# Patient Record
Sex: Female | Born: 1985 | Race: Black or African American | Hispanic: No | Marital: Single | Smoking: Never smoker
Health system: Southern US, Community
[De-identification: ages and names within clinical notes are randomized; demographics above are authoritative.]

## PROBLEM LIST (undated history)

## (undated) ENCOUNTER — Inpatient Hospital Stay (HOSPITAL_COMMUNITY): Payer: Self-pay

## (undated) DIAGNOSIS — R011 Cardiac murmur, unspecified: Secondary | ICD-10-CM

## (undated) DIAGNOSIS — R519 Headache, unspecified: Secondary | ICD-10-CM

## (undated) DIAGNOSIS — R51 Headache: Secondary | ICD-10-CM

## (undated) DIAGNOSIS — F32A Depression, unspecified: Secondary | ICD-10-CM

## (undated) DIAGNOSIS — F419 Anxiety disorder, unspecified: Secondary | ICD-10-CM

## (undated) DIAGNOSIS — B999 Unspecified infectious disease: Secondary | ICD-10-CM

## (undated) DIAGNOSIS — F329 Major depressive disorder, single episode, unspecified: Secondary | ICD-10-CM

## (undated) DIAGNOSIS — R87629 Unspecified abnormal cytological findings in specimens from vagina: Secondary | ICD-10-CM

## (undated) DIAGNOSIS — O365990 Maternal care for other known or suspected poor fetal growth, unspecified trimester, not applicable or unspecified: Secondary | ICD-10-CM

## (undated) DIAGNOSIS — E669 Obesity, unspecified: Secondary | ICD-10-CM

## (undated) HISTORY — PX: CRYOTHERAPY: SHX1416

## (undated) HISTORY — PX: WISDOM TOOTH EXTRACTION: SHX21

---
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
## 2007-02-25 ENCOUNTER — Inpatient Hospital Stay (HOSPITAL_COMMUNITY): Admission: AD | Admit: 2007-02-25 | Discharge: 2007-02-25 | Payer: Self-pay | Admitting: Obstetrics & Gynecology

## 2007-08-04 ENCOUNTER — Inpatient Hospital Stay (HOSPITAL_COMMUNITY): Admission: AD | Admit: 2007-08-04 | Discharge: 2007-08-04 | Payer: Self-pay | Admitting: Pediatrics

## 2007-09-25 ENCOUNTER — Inpatient Hospital Stay (HOSPITAL_COMMUNITY): Admission: AD | Admit: 2007-09-25 | Discharge: 2007-09-27 | Payer: Self-pay | Admitting: Obstetrics & Gynecology
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
## 2010-12-05 LAB — URINALYSIS, ROUTINE W REFLEX MICROSCOPIC
Hgb urine dipstick: NEGATIVE
Specific Gravity, Urine: 1.015
Urobilinogen, UA: 1
pH: 7.5

## 2010-12-07 LAB — CBC
MCHC: 32.7
MCV: 86.7
Platelets: 216
RBC: 3.61 — ABNORMAL LOW
RDW: 14.2

## 2010-12-07 LAB — RPR: RPR Ser Ql: NONREACTIVE

## 2010-12-14 LAB — URINALYSIS, ROUTINE W REFLEX MICROSCOPIC
Bilirubin Urine: NEGATIVE
Hgb urine dipstick: NEGATIVE
Nitrite: NEGATIVE
Urobilinogen, UA: 0.2
pH: 7

## 2010-12-14 LAB — GC/CHLAMYDIA PROBE AMP, GENITAL
Chlamydia, DNA Probe: NEGATIVE
GC Probe Amp, Genital: NEGATIVE

## 2010-12-14 LAB — URINE MICROSCOPIC-ADD ON

## 2010-12-14 LAB — WET PREP, GENITAL: Yeast Wet Prep HPF POC: NONE SEEN

## 2015-04-09 ENCOUNTER — Encounter (INDEPENDENT_AMBULATORY_CARE_PROVIDER_SITE_OTHER): Payer: Self-pay | Admitting: Family

## 2015-04-09 ENCOUNTER — Ambulatory Visit (INDEPENDENT_AMBULATORY_CARE_PROVIDER_SITE_OTHER): Payer: BLUE CROSS/BLUE SHIELD | Admitting: Family

## 2015-04-09 VITALS — BP 111/75 | HR 69 | Temp 98.8°F | Resp 16 | Ht 68.0 in | Wt 210.0 lb

## 2015-04-09 DIAGNOSIS — R300 Dysuria: Secondary | ICD-10-CM

## 2015-04-09 DIAGNOSIS — N3001 Acute cystitis with hematuria: Secondary | ICD-10-CM

## 2015-04-09 DIAGNOSIS — N760 Acute vaginitis: Secondary | ICD-10-CM

## 2015-04-09 LAB — POCT URINALYSIS AUTOMATED (IAH)
Bilirubin, UA POCT: NEGATIVE
Glucose, UA POCT: NEGATIVE
Ketones, UA POCT: NEGATIVE mg/dL
Nitrite, UA POCT: NEGATIVE
PH, UA POCT: 7 (ref 4.6–8)
Protein, UA POCT: NEGATIVE mg/dL
Specific Gravity, UA POCT: 1.02 mg/dL (ref 1.001–1.035)
Urobilinogen, UA POCT: 0.2 mg/dL

## 2015-04-09 MED ORDER — METRONIDAZOLE 0.75 % VA GEL
Freq: Every day | VAGINAL | Status: AC
Start: 2015-04-09 — End: 2015-04-14

## 2015-04-09 MED ORDER — FLUCONAZOLE 150 MG PO TABS
150.0000 mg | ORAL_TABLET | Freq: Once | ORAL | Status: AC
Start: 2015-04-09 — End: 2015-04-09

## 2015-04-09 MED ORDER — CIPROFLOXACIN HCL 500 MG PO TABS
500.0000 mg | ORAL_TABLET | Freq: Two times a day (BID) | ORAL | Status: AC
Start: 2015-04-09 — End: 2015-04-16

## 2015-04-09 NOTE — Progress Notes (Signed)
[facility]    PROGRESS NOTE      Patient: Melinda Gonzalez   Date: 04/09/2015   MRN: [id number]     History reviewed. No pertinent past medical history.  Social History     Social History   . Marital Status: Unknown     Spouse Name: N/A   . Number of Children: N/A   . Years of Education: N/A     Occupational History   . Not on file.     Social History Main Topics   . Smoking status: Never Smoker    . Smokeless tobacco: Not on file   . Alcohol Use: Yes      Comment: rarely    . Drug Use: Not on file   . Sexual Activity: Not on file     Other Topics Concern   . Not on file     Social History Narrative   . No narrative on file     Family History   Problem Relation Age of Onset   . No known problems Mother    . No known problems Father    . Bone cancer Paternal Uncle    . Liver cancer Paternal Uncle    . Hypertension Maternal Grandmother        ASSESSMENT/PLAN     Melinda Gonzalez is a 30 y.o. female    Chief Complaint   Patient presents with   . Vaginal Discharge        1. Dysuria  POCT UA Clinitek AX (urine dipstick)   2. Acute cystitis with hematuria  Urine culture    ciprofloxacin (CIPRO) 500 MG tablet   3. Acute vaginitis  fluconazole (DIFLUCAN) 150 MG tablet    SureSwab(TM) CTrach, N.Gono, T.Vag., TMA    metroNIDAZOLE (METROGEL) 0.75 % vaginal gel     Abstinence from douching and sexual activity is recommended until completion of treatment. If you remains sexually active, advise use of condoms as a precaution; some of the topical treatments are oil-based and can break condoms Alcohol consumption should be avoided during treatment and for 24 hours after last dose.  Avoiding causative agents, such as feminine hygiene products, latex condoms/diaphragms, douching, and irritants such as strong soaps or bubble baths, can help prevent vaginitis.  Lactobacillus acidophilus supplements in the diet may help to prevent vaginitis.  Please follow up if symptoms persist despite treatment.  Patient agreeable to  plan, all questions answered.    Increase fluid intake to 1-2 L per day. Hygiene & Post coital hygiene reviewed with patient.  Encourage daily vitamin C intake of 500-1000mg , cranberry tablets/pills if recurrent UTI for prevention, and adequate rest. Signs and symptoms reviewed with patient that warrants RTO. Patient agreeable to plan. Reviewed medication use and side effects with patient, please complete entire course of antibiotics. Reviewed with pt that ABX can make birth control pill less effective, therefore during treatment a backup method should be used. Also recommended taking probiotics or eat yogurt while on ABX.        No results found for this or any previous visit.    Risk & Benefits of the new medication(s) were explained to the patient (and family) who verbalized understanding & agreed to the treatment plan. Patient (family) encouraged to contact me/clinical staff with any questions/concerns      MEDICATIONS     No current outpatient prescriptions on file.     No current facility-administered medications for this visit.  Allergies   Allergen Reactions   . Penicillins Swelling       SUBJECTIVE     Chief Complaint   Patient presents with   . Vaginal Discharge        Vaginal Discharge  The patient's primary symptoms include vaginal discharge. Primary symptoms comment: Reports she just finished flagyl 3 days ago for BV and has been having vaginal discharge x 5 days. . This is a new problem. Episode onset: 5 days ago. The problem has been unchanged. The patient is experiencing no pain. She is not pregnant. Associated symptoms include dysuria and hematuria. Pertinent negatives include no abdominal pain, back pain, chills, constipation, diarrhea, discolored urine, fever, flank pain, frequency, headaches, nausea, painful intercourse, rash, urgency or vomiting. The vaginal discharge was thick, yellow and grey. There has been no bleeding. Treatments tried: reports she just finished flagyl 3 days ago for  BV, was treated by her OB. States she had itching and it resolved.  The treatment provided significant relief. She is not sexually active.     ROS     Review of Systems   Constitutional: Negative for fever and chills.   Respiratory: Negative for cough, shortness of breath and wheezing.    Cardiovascular: Negative for chest pain and palpitations.   Gastrointestinal: Negative for nausea, vomiting, abdominal pain, diarrhea and constipation.   Genitourinary: Positive for dysuria, hematuria and vaginal discharge. Negative for urgency, frequency and flank pain.   Musculoskeletal: Negative for back pain.   Skin: Negative for rash and wound.   Neurological: Negative for headaches.     The following portions of the patient's history were reviewed and updated as appropriate: Allergies, Current Medications, Past Family History, Past Medical history, Past social history, Past surgical history, and Problem List.    PHYSICAL EXAM     Filed Vitals:    04/09/15 0810   BP: 111/75   Pulse: 69   Temp: 98.8 F (37.1 C)   TempSrc: Oral   Resp: 16   Height: 1.727 m (5\' 8" )   Weight: 95.255 kg (210 lb)   SpO2: 98%     Physical Exam   Nursing note and vitals reviewed.  Constitutional: She is oriented to person, place, and time. She appears well-developed and well-nourished.   Cardiovascular: Normal rate, regular rhythm, normal heart sounds and intact distal pulses.    Pulmonary/Chest: Effort normal and breath sounds normal.   Abdominal: Soft. Bowel sounds are normal.   Genitourinary: Rectum normal. Pelvic exam was performed with patient prone. Vaginal discharge found.       Neurological: She is alert and oriented to person, place, and time.   Skin: Skin is warm and dry.     Ortho Exam  Neurologic Exam     Mental Status   Oriented to person, place, and time.       PROCEDURE(S)     Procedures    Signed,  Ardeen Fillers, DNP NP  04/09/2015

## 2015-04-11 LAB — SURESWAB(TM) CHLAMYDIA TRACHOMATIS, NEISERRIA GONORRHEA, TRICHOMONAS VAGINALIS, TMA
C.trachomatis RNA,TMA: NOT DETECTED
N. gonorrhoeae RNA, TMA: NOT DETECTED
Trichomonas Vaginalis RNA, TMA: NOT DETECTED

## 2015-04-11 LAB — URINE CULTURE

## 2015-05-14 ENCOUNTER — Ambulatory Visit (INDEPENDENT_AMBULATORY_CARE_PROVIDER_SITE_OTHER): Payer: BLUE CROSS/BLUE SHIELD

## 2015-05-14 ENCOUNTER — Encounter (INDEPENDENT_AMBULATORY_CARE_PROVIDER_SITE_OTHER): Payer: Self-pay

## 2015-05-14 VITALS — BP 111/73 | HR 69 | Temp 99.3°F | Resp 16

## 2015-05-14 DIAGNOSIS — B9689 Other specified bacterial agents as the cause of diseases classified elsewhere: Secondary | ICD-10-CM

## 2015-05-14 DIAGNOSIS — N760 Acute vaginitis: Secondary | ICD-10-CM

## 2015-05-14 DIAGNOSIS — N898 Other specified noninflammatory disorders of vagina: Secondary | ICD-10-CM

## 2015-05-14 LAB — POCT BACTERIAL VAGINOSIS: POCT BACTERIAL VAGINOSIS: POSITIVE — AB

## 2015-05-14 MED ORDER — METRONIDAZOLE 0.75 % VA GEL
Freq: Every day | VAGINAL | Status: AC
Start: 2015-05-14 — End: 2015-05-19

## 2015-05-14 NOTE — Progress Notes (Signed)
[facility]    PROGRESS NOTE      Patient: Melinda Gonzalez   Date: 05/14/2015   MRN: [id number]     History reviewed. No pertinent past medical history.  Social History     Social History   . Marital Status: Unknown     Spouse Name: N/A   . Number of Children: N/A   . Years of Education: N/A     Occupational History   . Not on file.     Social History Main Topics   . Smoking status: Never Smoker    . Smokeless tobacco: Not on file   . Alcohol Use: Yes      Comment: rarely    . Drug Use: Not on file   . Sexual Activity: Not on file     Other Topics Concern   . Not on file     Social History Narrative     Family History   Problem Relation Age of Onset   . No known problems Mother    . No known problems Father    . Bone cancer Paternal Uncle    . Liver cancer Paternal Uncle    . Hypertension Maternal Grandmother        ASSESSMENT/PLAN     Melinda Gonzalez is a 30 y.o. female    Chief Complaint   Patient presents with   . Vaginal Discharge        1. Vaginal discharge  - POCT BV  - SureSwab(TM) Plus    2. BV (bacterial vaginosis)  - metroNIDAZOLE (METROGEL) 0.75 % vaginal gel; Place vaginally daily.  Dispense: 25 g; Refill: 0       POCT BV positive. Will treat with metrogel daily x 5 days. If sureswab results indicate need to treat for additional infection, will notify patient. Patient in agreement with plan, all questions answered.       Results for orders placed or performed in visit on 05/14/15   POCT BV   Result Value Ref Range    POCT BACTERIAL VAGINOSIS Positive (A) Negative       Risk & Benefits of the new medication(s) were explained to the patient (and family) who verbalized understanding & agreed to the treatment plan. Patient (family) encouraged to contact me/clinical staff with any questions/concerns      MEDICATIONS     Current Outpatient Prescriptions   Medication Sig Dispense Refill   . metroNIDAZOLE (METROGEL) 0.75 % vaginal gel Place vaginally daily. 25 g 0     No current  facility-administered medications for this visit.       Allergies   Allergen Reactions   . Penicillins Swelling       SUBJECTIVE     Chief Complaint   Patient presents with   . Vaginal Discharge        Vaginal Discharge  The patient's primary symptoms include vaginal discharge (and itching). The patient's pertinent negatives include no pelvic pain. This is a recurrent problem. The current episode started today (happened last week as well, was like a greyish color at that time). The patient is experiencing no pain. Pertinent negatives include no abdominal pain, chills, dysuria, fever, flank pain, frequency, nausea or urgency. The vaginal discharge was grey and green. There has been no bleeding.     Had STI panel done in January 2017 was negative for trich, gonorrhea and chlamydia. Was positive for yeast and BV at that time.  Sexually active, with single partner, unsure if  he is monogamous    ROS     Review of Systems   Constitutional: Negative for fever and chills.   Gastrointestinal: Negative for nausea and abdominal pain.   Genitourinary: Positive for vaginal discharge (and itching). Negative for dysuria, urgency, frequency, flank pain, vaginal bleeding, genital sores, vaginal pain, pelvic pain and dyspareunia.       The following portions of the patient's history were reviewed and updated as appropriate: Allergies, Current Medications, Past Family History, Past Medical history, Past social history, Past surgical history, and Problem List.    PHYSICAL EXAM     Filed Vitals:    05/14/15 1333   BP: 111/73   Pulse: 69   Temp: 99.3 F (37.4 C)   TempSrc: Oral   Resp: 16   SpO2: 97%       Physical Exam   Constitutional: She appears well-developed and well-nourished. No distress.   Cardiovascular: Normal rate, regular rhythm and normal heart sounds.    Pulmonary/Chest: Effort normal and breath sounds normal.   Genitourinary: There is no rash, tenderness or lesion on the right labia. There is no rash, tenderness or  lesion on the left labia. No erythema or tenderness in the vagina. No foreign body around the vagina. No signs of injury around the vagina. Vaginal discharge (gray discharge noted on exam) found.   Psychiatric: She has a normal mood and affect. Her behavior is normal. Judgment and thought content normal.     Ortho Exam  Neurologic Exam    PROCEDURE(S)     Procedures        Signed,  Barnetta Hammersmith, NP  05/14/2015

## 2015-05-17 LAB — SURESWAB(TM) PLUS
Atopobium vaginae: 5.6 Log (cells/mL)
BV Category: UNDETERMINED — AB
C.trachomatis RNA,TMA: NOT DETECTED
Candida Glabrata,DNA: NOT DETECTED
Candida Parapsilosis,DNA: NOT DETECTED
Candida Tropicalis,DNA: NOT DETECTED
Candida albicans DNA: DETECTED — AB
Gardnerella vaginalis: 7.7 Log (cells/mL)
Lactobacillus species: DETECTED
Megasphaera species: NOT DETECTED
Neisseria gonorrhoeae by PCR: NOT DETECTED
Trichomonas Vaginalis RNA, QL TMA: NOT DETECTED

## 2015-05-18 ENCOUNTER — Other Ambulatory Visit (INDEPENDENT_AMBULATORY_CARE_PROVIDER_SITE_OTHER): Payer: Self-pay | Admitting: Family

## 2015-05-18 DIAGNOSIS — B3731 Acute candidiasis of vulva and vagina: Secondary | ICD-10-CM

## 2015-05-18 MED ORDER — FLUCONAZOLE 150 MG PO TABS
150.0000 mg | ORAL_TABLET | Freq: Once | ORAL | Status: AC
Start: 2015-05-18 — End: 2015-05-18

## 2015-06-07 ENCOUNTER — Emergency Department: Payer: BLUE CROSS/BLUE SHIELD

## 2015-06-07 ENCOUNTER — Emergency Department
Admission: EM | Admit: 2015-06-07 | Discharge: 2015-06-07 | Disposition: A | Discharge status: Home / Self Care | Payer: BLUE CROSS/BLUE SHIELD | Attending: Emergency Medicine | Admitting: Emergency Medicine

## 2015-06-07 ENCOUNTER — Emergency Department: Payer: Auto Insurance (includes no fault)

## 2015-06-07 DIAGNOSIS — S39012A Strain of muscle, fascia and tendon of lower back, initial encounter: Secondary | ICD-10-CM | POA: Insufficient documentation

## 2015-06-07 DIAGNOSIS — S161XXA Strain of muscle, fascia and tendon at neck level, initial encounter: Secondary | ICD-10-CM | POA: Insufficient documentation

## 2015-06-07 MED ORDER — HYDROCODONE-ACETAMINOPHEN 5-325 MG PO TABS
1.0000 | ORAL_TABLET | Freq: Once | ORAL | Status: AC
Start: 2015-06-07 — End: 2015-06-07
  Administered 2015-06-07: 1 via ORAL

## 2015-06-07 MED ORDER — HYDROCODONE-ACETAMINOPHEN 5-325 MG PO TABS
ORAL_TABLET | ORAL | Status: AC
Start: 2015-06-07 — End: ?
  Filled 2015-06-07: qty 1

## 2015-06-07 MED ORDER — CYCLOBENZAPRINE HCL 10 MG PO TABS
ORAL_TABLET | ORAL | Status: AC
Start: 2015-06-07 — End: ?
  Filled 2015-06-07: qty 1

## 2015-06-07 MED ORDER — HYDROCODONE-ACETAMINOPHEN 5-325 MG PO TABS
1.0000 | ORAL_TABLET | Freq: Four times a day (QID) | ORAL | Status: DC | PRN
Start: 2015-06-07 — End: 2015-06-11

## 2015-06-07 MED ORDER — CYCLOBENZAPRINE HCL 10 MG PO TABS
10.0000 mg | ORAL_TABLET | Freq: Once | ORAL | Status: AC
Start: 2015-06-07 — End: 2015-06-07
  Administered 2015-06-07: 10 mg via ORAL

## 2015-06-07 MED ORDER — CYCLOBENZAPRINE HCL 10 MG PO TABS
10.0000 mg | ORAL_TABLET | Freq: Three times a day (TID) | ORAL | Status: DC | PRN
Start: 2015-06-07 — End: 2015-10-23

## 2015-06-07 NOTE — Discharge Instructions (Signed)
Understanding Cervical Strain    There are 7 bones (vertebrae) in the neck that are part of the spine. These are called the cervical spine. Cervical strain is a medical term for neck pain. The neck has several layers of muscles. These are connected with tendons to the cervical spine and other bones. Neck pain is often the result of injury to these muscles and tendons.  Causes of cervical strain  Different types of stress on the neck can damage muscles and tendons (soft tissues) and cause cervical strain. Cervical tissues can be damaged by:   The neck being forced past its normal range of motion, such as in a car accident or sports injury   Constant, low-level stress, such as from poor posture or a poorly set-up workspace  Symptoms of cervical strain  These may include:   Neck pain or stiffness   Pain in the shoulders or upper back   Muscle spasms   Headache, often starting at the base of the neck   Irritability, difficulty concentrating, or sleeplessness  Treatment for cervical strain  This problem often gets better on its own. Treatments aim to reduce pain and inflammation and increase the range of motion of the neck. Possible treatments include:   Over-the-counter or prescription pain medicine. These help relieve pain and inflammation.   Stretching exercises to decrease neck stiffness.   Massage to decrease neck stiffness.   Cold or heat pack. These help reduce pain and swelling.  Call 911  Call emergency services right away if you have any of these:   Face drooping or numbness   Numbness or weakness, especially in the arms or on one side   Slurred speech or difficulty speaking   Blurred vision   When to call your healthcare provider  Call your healthcare provider right away if you have any of these:   Fever of 100.4F (38C) or higher, or as directed   Pain or stiffness that gets worse   Symptoms that don't get better, or get worse   Numbness, tingling, weakness or shooting pains into the  arms or legs   New symptoms  Date Last Reviewed: 05/19/2014   2000-2016 [employer] [address], [place] [address]. All rights reserved. This information is not intended as a substitute for professional medical care. Always follow your healthcare professional's instructions.
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
## 2015-06-08 NOTE — ED Notes (Signed)
Spoke with patient, she is complaining of wrist pain that is new.  Advised patient if pain did not improve to return to the ED or her PCP for an xray.

## 2015-06-11 ENCOUNTER — Emergency Department
Admission: EM | Admit: 2015-06-11 | Discharge: 2015-06-11 | Disposition: A | Discharge status: Home / Self Care | Payer: BLUE CROSS/BLUE SHIELD | Attending: Emergency Medicine | Admitting: Emergency Medicine

## 2015-06-11 ENCOUNTER — Emergency Department: Payer: BLUE CROSS/BLUE SHIELD

## 2015-06-11 DIAGNOSIS — Y92410 Unspecified street and highway as the place of occurrence of the external cause: Secondary | ICD-10-CM | POA: Insufficient documentation

## 2015-06-11 DIAGNOSIS — S134XXA Sprain of ligaments of cervical spine, initial encounter: Secondary | ICD-10-CM | POA: Insufficient documentation

## 2015-06-11 DIAGNOSIS — S73102A Unspecified sprain of left hip, initial encounter: Secondary | ICD-10-CM

## 2015-06-11 DIAGNOSIS — S233XXA Sprain of ligaments of thoracic spine, initial encounter: Secondary | ICD-10-CM | POA: Insufficient documentation

## 2015-06-11 DIAGNOSIS — S139XXA Sprain of joints and ligaments of unspecified parts of neck, initial encounter: Secondary | ICD-10-CM | POA: Diagnosis present

## 2015-06-11 DIAGNOSIS — S239XXA Sprain of unspecified parts of thorax, initial encounter: Secondary | ICD-10-CM | POA: Diagnosis present

## 2015-06-11 DIAGNOSIS — S63502A Unspecified sprain of left wrist, initial encounter: Secondary | ICD-10-CM | POA: Insufficient documentation

## 2015-06-11 DIAGNOSIS — S339XXA Sprain of unspecified parts of lumbar spine and pelvis, initial encounter: Secondary | ICD-10-CM | POA: Insufficient documentation

## 2015-06-11 DIAGNOSIS — S335XXA Sprain of ligaments of lumbar spine, initial encounter: Secondary | ICD-10-CM

## 2015-06-11 MED ORDER — OXYCODONE-ACETAMINOPHEN 5-325 MG PO TABS
ORAL_TABLET | ORAL | Status: DC
Start: 2015-06-11 — End: 2015-10-23

## 2015-06-11 NOTE — ED Notes (Signed)
Upon Dr. Corky Downs assessment patient also stated left wrist, knee, and ankle hurt as well.
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
## 2015-06-11 NOTE — Discharge Instructions (Signed)
General Sprain    You were diagnosed with a sprain.     A sprain is a ligament injury, usually a tear or partial tear. Sprains can hurt as much as broken bones. There are different degrees of injury. A first-degree sprain is a minor tear. A second-degree sprain is a partial tear of the ligament. A third-degree sprain often involves a small fracture (break) of the bone that the ligament is attached to.    The most common joints affected by sprains are the ankles, then the wrists. However, any joint can be affected. Most commonly, patients will have pain (often worse with moving the joint), swelling, and bruising. In rare cases, the entire ligament can be torn through. In this case, the patient will not be able to move the joint at all. This is because the muscle is no longer connected to the bone.     While you were here, you probably had some imaging done. This may have included an x-ray, a CT scan or an MRI.     At this point, your doctors think you are safe to go home.     Though we don't believe your condition is dangerous right now, it is important to be careful. Sometimes a problem that seems mild can become serious later. This is why it is very important that you return here or go to the nearest Emergency Department if you are not improving or your symptoms are getting worse.    Take all medicines as prescribed. Your doctor may prescribe you pain medications to treat yourpain. You can also use over-the-counter medicines like acetaminophen (Tylenol) or anti-inflammatory medicinelike ibuprofen (Advil, Motrin) or Naproxen (Aleve, Naprosyn). It is important to follow the directions for taking these medications.    Keep all your doctor's appointments. Following up with your doctor or the referral doctor is very important.    Some things you can do to help your injury are: Resting, Icing, Compressing and Elevating the injured area. Remember this as "RICE."     REST: Limit the use of the painful  body part.     ICE: By applying ice to the affected area, swelling and pain can be reduced. Place some ice cubes in a re-sealable (Ziploc) bag and add some water. Put a thin washcloth between the bag and the skin. Apply the ice bag to the area for at least 20 minutes. Do this at least 4 times per day. It is okay to do this more often than directed. You can also do it for longer than directed. NEVER APPLY ICE DIRECTLY TO THE SKIN.     COMPRESS: Compression means to apply pressure around the painful area such as with a splint, cast or an ace bandage. Compression decreases swelling and improves comfort. Compression should be tight enough to relieve swelling but not so tight as to decrease circulation. Increasing pain, numbness, tingling, or change in skin color, are all signs of decreased circulation.     ELEVATE: Elevate the painful part.    YOU SHOULD SEEK MEDICAL ATTENTION IMMEDIATELY, EITHER HERE OR AT THE NEAREST EMERGENCY DEPARTMENT, IF ANY OF THE FOLLOWING OCCUR:     There is redness, warmth, or severe swelling in the joint space. This is especially important if you get a fever (temperature higher than 100.4F or 38C) or chills.   Your pain gets much worse.   Your ankle or foot starts to tingle or it gets numb.   Your foot is cold or pale. This might   might mean there is a problem with circulation (blood supply).    If you can t follow up with your doctor, or if at any time you feel you need to be rechecked or seen again, come back here or go to the nearest emergency department.              Wrist Sprain    You have been diagnosed with a sprain of the wrist.    A sprain is an injury to a ligament (a type of connective tissue). It is usually a tear or partial tear. Ligaments are the tough bands that hold the bones in your wrist together. Sprains are often as painful as fractures (broken bones). Sprains are often divided into 3 types. The type depends on how bad the injury is: a first-degree sprain is  considered a minor tear. With a third degree sprain, there is often a chip fracture of the bone that the ligament is attached to.    Generally, sprain treatment includes the use of pain medicine and a splint to reduce movement. Treatment also involves Resting, Icing, Compressing and Elevating the injured area. Remember this as "RICE."     Rest: Limit the use of the injured body part.   ICE: By applying ice to the affected area, swelling and pain can be reduced. Place some ice cubes in a re-sealable (Ziploc) bag and add some water. Put a thin washcloth between the bag and the skin. Apply the ice bag to the area for at least 20 minutes. Do this at least 4 times per day. Using the ice for longer times and more frequently is OK. NEVER APPLY ICE DIRECTLY TO THE SKIN.   COMPRESS: Compression means to apply pressure around the injured area such as with a splint, cast or an ACE bandage. Compression decreases swelling and improves comfort. Compression should be tight enough to relieve swelling but not so tight as to decrease circulation. Increasing pain, numbness, tingling, or change in skin color, are all signs of decreased circulation.   Elevate: Elevate the injured part. For example, you can elevate a leg by putting it on a chair while sitting. You can also prop it up on pillows while lying down.    You have been given an ACE bandage to wear. Wear the ACE bandage to compress (keep light pressure on) the injured area. This increases comfort. It also keeps swelling down. The ACE bandage should fit snugly. It should not be so tight that it lowers circulation (blood flow). Worsening pain, numbness, tingling or changes in skin color are all signs of decreased circulation. Watch for swelling of the area past the ACE wrap. Check capillary refill (circulation) in the nail beds. Press on the nail bed and let go. The nail bed should turn white. It should then go pink in less than 2 seconds. Loosen the wrap if it is too  tight.    Wear the ACE bandage:   For the next 7 days.    YOU SHOULD SEEK MEDICAL ATTENTION IMMEDIATELY, EITHER HERE OR AT THE NEAREST EMERGENCY DEPARTMENT, IF ANY OF THE FOLLOWING OCCURS:   There is a severe increase in pain in the injured area.   You have new numbness or tingling in or below the injured area.   You develop a cold, pale hand or foot that seems to have blood supply problems.              Thoracic Strain    You have strained  your thoracic spine.    The thoracic spine is also called the upper or middle back.    A strain happens when a muscle is stretched, torn or injured. The pain that you feel is caused by inflammation (swelling) or bruising in the muscle. A strain is not the same as a sprain. A sprain is an injury to a ligament that holds bones    A thoracic strain occurs when twisting, bending or lifting causes the muscles of the middle back to stretch. Some of the muscle fibers tear leading to stiffness and pain. It is common to experience pain over the muscles around the middle spine but not over the bones themselves. A back strain should not be confused with a more serious low-back condition called a herniated disk (slipped disk).    The x-rays of your back showed no evidence of broken bones.    Try these ideas to ease the pain in your lower back:   Apply a warm moist heat towel to the back for 20 minutes at a time, at least 4 times per day.   Gently massage the injured muscles to relax them and ease the pain.   Avoid any heavy lifting or bending. You can resume normal daily activities as long as they don t make the pain worsen.    YOU SHOULD SEEK MEDICAL ATTENTION IMMEDIATELY, EITHER HERE OR AT THE NEAREST EMERGENCY DEPARTMENT, IF ANY OF THE FOLLOWING OCCURS:   You have numbness, tingling, or loss of feeling in the arms or legs.   You feel weakness in the arms or legs.   You can't control your bowels or bladder (you soil or wet yourself).   You have a severe increase in  pain.   Your pain does not improve within 4 weeks or is bad enough to seriously limit your normal activities.            Back Pain, Cervical NOS    You have been seen for neck pain. This area is also called the cervical spine.    The cervical spine is between the base of the skull and the top of the shoulders.    There are many different reasons for neck pain. Some of the more common include: Bone pain, muscle strain, muscle spasm, pain from overuse, and pinched nerves.     The x-rays of your neck showed no broken bones.    The doctor still does not know the exact cause of your pain. Your problem does not seem to be from a dangerous cause. It is OK for you to go home today.    Some things you can try to help your neck feel better are:   Apply a warm damp washcloth to the neck for 20 minutes at a time, at least 4 times per day. This will reduce your pain. Massaging your neck might also help.   Have someone massage the sore parts of your neck.   Don t do any heavy lifting or bending. You can go back to normal daily activities if they don t make the pain worse.   Use the over-the-counter anti-inflammatory medication ibuprofen (also known as Advil or Motrin) as directed on the package to help with pain and inflammation.    You have been given a soft cervical collar. The collar will support your neck. It might help your pain. It will NOT keep you from hurting your neck again. It will also NOT make you heal faster.    It  is normal for the pain to last for the next few days. If your pain gets better, you probably do not need to see a doctor. However, if your symptoms get worse or you have new symptoms, you should return here or go to the nearest Emergency Department.    Call your doctor or go to the nearest Emergency Department if you your pain does not improve or your pain is bad enough to seriously limit your normal activities.    YOU SHOULD SEEK MEDICAL ATTENTION IMMEDIATELY, EITHER HERE OR AT THE  NEAREST EMERGENCY DEPARTMENT, IF ANY OF THE FOLLOWING OCCURS:   You think the pain is coming from somewhere other than your back. This can include chest pain. This is sometimes from angina (heart pains) or other dangerous causes.   You have shortness of breath, sweating, chest pain (or pressure, heaviness, indigestion, etc).   Your arms and legs tingle or get numb (lose feeling).   Your arms or legs are weak.   You have fever (temperature higher than 100.31F / 38C) along with headache and neck pain.   Your neck pain is getting worse.   You lose control of your bladder or bowels. If this were to happen, it may cause you to wet or soil yourself.   You have problems urinating (peeing).
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
## 2015-07-19 ENCOUNTER — Ambulatory Visit (FREE_STANDING_LABORATORY_FACILITY): Payer: BLUE CROSS/BLUE SHIELD | Admitting: Registered Nurse

## 2015-07-19 ENCOUNTER — Encounter (INDEPENDENT_AMBULATORY_CARE_PROVIDER_SITE_OTHER): Payer: Self-pay | Admitting: Registered Nurse

## 2015-07-19 VITALS — BP 113/76 | HR 89 | Temp 98.5°F | Resp 18 | Wt 198.0 lb

## 2015-07-19 DIAGNOSIS — J029 Acute pharyngitis, unspecified: Secondary | ICD-10-CM

## 2015-07-19 LAB — POCT RAPID STREP A: Rapid Strep A Screen POCT: NEGATIVE

## 2015-07-19 NOTE — Patient Instructions (Signed)
When You Have a Sore Throat  A sore throat can be painful. There are many reasons why you may have a sore throat. Your healthcare provider will work with you to find the cause of your sore throat. He or she will also find the best treatment for you.      What causes a sore throat?  Sore throats can be caused or worsened by:   Cold or flu viruses   Bacteria   Irritants such as tobacco smoke or air pollution   Acid reflux  A healthy throat  The tonsils are on the sides of the throat near the base of the tongue. They collect viruses and bacteria and help fight infection. The throat (pharynx) is the passage for air. Mucus from the nasal cavity also moves down the passage.  An inflamed throat  The tonsils and pharynx can become inflamed due to a cold or flu virus. Postnasal drip (excess mucus draining from the nasal cavity) can irritate the throat. It can also make the throat or tonsils more likely to be infected by bacteria. Severe, untreated tonsillitis in children or adults can cause a pocket of pus (abscess) to form near the tonsil.  Your evaluation  A medical evaluation can help find the cause of your sore throat. It can also help your healthcare providerchoose the best treatment for you. The evaluation may include a health history, physical exam, and diagnostic tests.  Health history  Your healthcare provider may ask you the following:   How long has the sore throat lasted and how have you been treating it?   Do you have any other symptoms, such as body aches, fever, or cough?   Does your sore throat recur? If so, how often? How many days of school or work have you missed because of a sore throat?   Do you have trouble eating or swallowing?   Have you been told that you snore or have other sleep problems?   Do you have bad breath?   Do you cough up bad-tasting mucus?  Physical exam  During the exam, your healthcare provider checks your ears, nose, and throat for problems. He or she also checks for  swelling in the neck, and may listen to your chest.  Possible tests  Other tests your healthcare provider may perform include:   A throat swab to check for bacteria such asstreptococcus (the bacteria that causes strep throat)   A blood test to check for mononucleosis (a viral infection)   A chest X-ray to rule out pneumonia, especially if you have a cough  Treating a sore throat  Treatment depends on many factors. What is the likely cause? Is the problem recent? Does it keep coming back? In many cases, the best thing to do is to treat the symptoms, rest, and let the problem heal itself. Antibiotics may help clear up some bacterial infections. For cases of severe or recurring tonsillitis, the tonsils may need to be removed.  Relieving your symptoms   Don't smoke, and avoid secondhand smoke.   For children, try throat sprays or Popsicles. Adults and older children may try lozenges.   Drink warm liquids to soothe the throat and help thin mucus. Avoid alcohol, spicy foods, and acidic drinks such as orange juice. These can irritate the throat.   Gargle with warm saltwater (1teaspoon of salt to8ounces of warm water).   Use a humidifier to keep air moist and relieve throat dryness.   Try over-the-counter pain relievers such   as acetaminophen or ibuprofen. Use as directed, and don't exceed the recommended dose. Don't give aspirin to children.   Are antibiotics needed?  If your sore throat is due to a bacterial infection, antibiotics may speed healing and prevent complications. Although group A streptococcus ("strep throat" or GAS) is the major treatable infection for a sore throat, GAS causes only 5% to 15% of sore throats in adults who seek medical care. Most sore throats are caused by cold or flu viruses. And antibiotics don't treat viral illness. In fact, using antibiotics when they're not needed may produce bacteria that are harder to kill. Your healthcare provider will prescribe antibiotics only if he or  she thinks they are likely to help.  If antibiotics are prescribed  Take the medicine exactly as directed. Be sure to finish your prescription even if you're feeling better. And be sure to ask your healthcare provider or pharmacist what side effects are common and what to do about them.  Is surgery needed?  In some cases, tonsils need to be removed. This is often done as outpatient (same-day) surgery. Your healthcare provider may advise removing the tonsils in cases of:   Several severe bouts of tonsillitis in a year. "Severe" episodes include those that lead to missed days of school or work, or that need to be treated with antibiotics.   Tonsillitis that causes breathing problems during sleep   Tonsillitis caused by food particles collecting in pouches in the tonsils (cryptic tonsillitis)  Call your healthcare provider if any of the following occur:   Symptoms worsen, or new symptoms develop.   Swollen tonsils make breathing difficult.   The pain is severe enough to keep you from drinking liquids.   A skin rash, hives, or wheezing develops. Any of these could signal an allergic reaction to antibiotics.   Symptoms don't improve within a week.   Symptoms don't improve within2 to 3days of starting antibiotics.   Date Last Reviewed: 12/10/2014   2000-2016 [employer] [address], [place] [address]. All rights reserved. This information is not intended as a substitute for professional medical care. Always follow your healthcare professional's instructions.

## 2015-07-19 NOTE — Progress Notes (Signed)
[facility]    PROGRESS NOTE      Patient: Melinda Gonzalez   Date: 07/19/2015   MRN: [id number]     History reviewed. No pertinent past medical history.  Social History     Social History   . Marital Status: Single     Spouse Name: N/A   . Number of Children: N/A   . Years of Education: N/A     Occupational History   . Not on file.     Social History Main Topics   . Smoking status: Never Smoker    . Smokeless tobacco: Never Used   . Alcohol Use: No      Comment: rarely    . Drug Use: No   . Sexual Activity: Not on file     Other Topics Concern   . Not on file     Social History Narrative     Family History   Problem Relation Age of Onset   . No known problems Mother    . No known problems Father    . Bone cancer Paternal Uncle    . Liver cancer Paternal Uncle    . Hypertension Maternal Grandmother        ASSESSMENT/PLAN     Melinda Gonzalez is a 30 y.o. female    Chief Complaint   Patient presents with   . Sore Throat        1. Sore throat  - POCT rapid strep A  - Culture, Throat (IL/QU)         Results for orders placed or performed in visit on 07/19/15   POCT rapid strep A   Result Value Ref Range    POCT QC Pass     Rapid Strep A Screen POCT Negative  Negative    Comment       Negative Results should be confirmed by throat Cx to confirm absence of Strep A inf.       Risk & Benefits of the new medication(s) were explained to the patient (and family) who verbalized understanding & agreed to the treatment plan. Patient (family) encouraged to contact me/clinical staff with any questions/concerns      MEDICATIONS     Current Outpatient Prescriptions   Medication Sig Dispense Refill   . levonorgestrel (MIRENA) 20 MCG/24HR IUD 1 each by Intrauterine route once. Placed May 2014     . cyclobenzaprine (FLEXERIL) 10 MG tablet Take 1 tablet (10 mg total) by mouth every 8 (eight) hours as needed for Muscle spasms (pain or muscle spasm). 20 tablet 0   . oxyCODONE-acetaminophen (PERCOCET) 5-325 MG per tablet 1-2  tablets every 6 hours if needed for pain. 20 tablet 0     No current facility-administered medications for this visit.       Allergies   Allergen Reactions   . Penicillins Swelling       SUBJECTIVE     Chief Complaint   Patient presents with   . Sore Throat        HPI Comments:       Sore Throat   This is a new problem. Episode onset: last night. The problem has been gradually worsening. There has been no fever. The pain is at a severity of 8/10. The pain is severe. Pertinent negatives include no congestion, coughing, headaches, shortness of breath, trouble swallowing or vomiting. Associated symptoms comments: Sore throat, postnasal drip and sore neck.. Treatments tried: throat lozenges.  ROS     Review of Systems   Constitutional: Negative for fever, chills, diaphoresis, appetite change and fatigue.   HENT: Positive for postnasal drip and sore throat. Negative for congestion, sinus pressure, sneezing and trouble swallowing.    Respiratory: Negative for cough, chest tightness, shortness of breath and wheezing.    Cardiovascular: Negative for chest pain.   Gastrointestinal: Negative for nausea, vomiting and abdominal distention.   Skin: Negative for rash.   Neurological: Negative for dizziness and headaches.       The following portions of the patient's history were reviewed and updated as appropriate: Allergies, Current Medications, Past Family History, Past Medical history, Past social history, Past surgical history, and Problem List.    PHYSICAL EXAM     Filed Vitals:    07/19/15 1300   BP: 113/76   Pulse: 89   Temp: 98.5 F (36.9 C)   TempSrc: Oral   Resp: 18   Weight: 89.812 kg (198 lb)   SpO2: 96%       Physical Exam   Nursing note and vitals reviewed.  Constitutional: She is oriented to person, place, and time. She appears well-developed and well-nourished. No distress.   HENT:   Head: Normocephalic.   Right Ear: Tympanic membrane, external ear and ear canal normal.   Left Ear: Tympanic membrane,  external ear and ear canal normal.   Nose: Nose normal.   Mouth/Throat: Uvula is midline. Posterior oropharyngeal erythema (mild, postnasal drip) present. No oropharyngeal exudate or posterior oropharyngeal edema.   Eyes: Conjunctivae and EOM are normal. Pupils are equal, round, and reactive to light.   Neck: Normal range of motion. Neck supple.   Cardiovascular: Normal rate, regular rhythm and normal heart sounds.    Pulmonary/Chest: Breath sounds normal. No respiratory distress. She has no wheezes. She has no rales. She exhibits no tenderness.   Musculoskeletal: She exhibits no edema or tenderness.   Lymphadenopathy:     She has no cervical adenopathy.   Neurological: She is alert and oriented to person, place, and time. She has normal reflexes.   Skin: No rash noted.   Psychiatric: She has a normal mood and affect. Her behavior is normal. Judgment and thought content normal.     Ortho Exam  Neurologic Exam     Mental Status   Oriented to person, place, and time.     Cranial Nerves     CN III, IV, VI   Pupils are equal, round, and reactive to light.  Extraocular motions are normal.       PROCEDURE(S)     Procedures        Signed,  Alinda Money, FNP  07/19/2015

## 2015-07-23 ENCOUNTER — Other Ambulatory Visit (INDEPENDENT_AMBULATORY_CARE_PROVIDER_SITE_OTHER): Payer: Self-pay | Admitting: Family

## 2015-07-23 DIAGNOSIS — J020 Streptococcal pharyngitis: Secondary | ICD-10-CM

## 2015-07-23 MED ORDER — AZITHROMYCIN 250 MG PO TABS
ORAL_TABLET | ORAL | Status: AC
Start: 2015-07-23 — End: 2015-07-28

## 2015-07-24 ENCOUNTER — Encounter (INDEPENDENT_AMBULATORY_CARE_PROVIDER_SITE_OTHER): Payer: Self-pay | Admitting: Family

## 2015-07-24 ENCOUNTER — Other Ambulatory Visit (INDEPENDENT_AMBULATORY_CARE_PROVIDER_SITE_OTHER): Payer: Self-pay | Admitting: Family

## 2015-07-24 ENCOUNTER — Telehealth (INDEPENDENT_AMBULATORY_CARE_PROVIDER_SITE_OTHER): Payer: Self-pay

## 2015-07-24 DIAGNOSIS — J020 Streptococcal pharyngitis: Secondary | ICD-10-CM

## 2015-07-24 NOTE — Telephone Encounter (Signed)
Wait, scratch that, biaxin is the same med group as azithromycin...    Ask if the blood is mixed with the stool or streaked like hemorrhoids would be.    Also, she may be able to take keflex for the strep, but is at higher risk for allergy due to her listed allergy to PCN.     Let me know what she says!

## 2015-07-24 NOTE — Telephone Encounter (Signed)
Patient states she has had hemorrhoids in the past, but she hasn't had a flare up in a while. The bloody stool was more of the toilet water being red. She denies straining to have a BM at that time.    I gave her the option of switching to Keflex and the possible risks due to PCN reaction in the past. She said she would prefer to continue with the azithromycin. Recommended if she experiences worsening symptoms or dizziness to go to the ER.

## 2015-07-24 NOTE — Telephone Encounter (Signed)
Patient called and said she took her first two doses on azithromycin 250mg  today around 11:45 am. After taking the medication, she reports having bloody loose "slightly formed" stools. She is not on her period. She doesn't think she has taken this medication before. She said she is OK with continuing the medication but wanted to check to see if it was ok.

## 2015-07-24 NOTE — Telephone Encounter (Signed)
We can change antibiotics again to Biaxin BID. Stop Azithromycin. Pt needs to ensure that she is increasing her probiotics and taking meds with food. Monitor for additional signs of adverse/ allergic reaction. If bloody stools persist pt needs to f/u, if severe go to ER. Any severe abdominal pain go to ER.

## 2015-07-25 NOTE — Progress Notes (Signed)
No new orders

## 2015-10-23 ENCOUNTER — Emergency Department
Admission: EM | Admit: 2015-10-23 | Discharge: 2015-10-23 | Disposition: A | Discharge status: Home / Self Care | Payer: BLUE CROSS/BLUE SHIELD | Attending: Emergency Medicine | Admitting: Emergency Medicine

## 2015-10-23 ENCOUNTER — Emergency Department: Payer: BLUE CROSS/BLUE SHIELD

## 2015-10-23 DIAGNOSIS — H1032 Unspecified acute conjunctivitis, left eye: Secondary | ICD-10-CM | POA: Insufficient documentation

## 2015-10-23 MED ORDER — OFLOXACIN 0.3 % OP SOLN
2.0000 [drp] | Freq: Four times a day (QID) | OPHTHALMIC | Status: DC
Start: 2015-10-23 — End: 2015-10-23

## 2015-10-23 MED ORDER — OFLOXACIN 0.3 % OP SOLN
2.0000 [drp] | Freq: Four times a day (QID) | OPHTHALMIC | Status: AC
Start: 2015-10-23 — End: 2015-10-30

## 2015-10-23 NOTE — Discharge Instructions (Signed)
Return to ER immediately if worse in any way or for any other concern.     Consider over the counter Zatidor as needed for itch.     Follow up with primary care within the next 2 days. Call to make an appointment. If you are unable to follow up, return to ER.     Conjunctivitis    You were diagnosed with conjunctivitis. This is also called "pink eye".    Conjunctivitis is an inflammation of the conjunctiva. These are the thin coverings of the white part of the eye and insides of the eyelids. It is caused by many different things. This includes viruses and bacteria. It even includes chemicals. Particles of "junk" that irritate the eye can also be a cause. Viruses are the most common cause.    Symptoms of conjunctivitis include pink eye and redness and drainage. It may feel like there is something in your eye (foreign body sensation). Your lid may get swollen. Your eyelids may also mat or get stuck in the morning.    Conjunctivitis can be very contagious. This means it can easily spread to others. You SHOULD NOT share hygienic items. This includes towels, make-up and tissues. You SHOULD NOT share clothing items. Wash your hands several times a day. Avoid touching your eyes.    Bacterial conjunctivitis is treated with warm compresses. It is also treated with ophthalmic (eye) antibiotics. These antibiotics are topical (not swallowed). Medicine is generally used for 5-7 days.    You SHOULD NOT wear contact lenses while you have conjunctivitis. Wait 48 hours after the infection completely clears up before using them again.    There is no specific follow-up needed. However, if you get any of the problems listed below, you will need a follow-up. If you don t get better as expected, you will also need a follow-up. It is always a good idea to get rechecked by your eye doctor if possible.    YOU SHOULD SEEK MEDICAL ATTENTION IMMEDIATELY, EITHER HERE OR AT THE NEAREST EMERGENCY DEPARTMENT, IF ANY OF THE FOLLOWING  OCCURS:   Increasing eye pain.   Vision problems (problems seeing).   Photophobia (light bothering your eyes).   You don t get better after a few days or symptoms get worse at any time.

## 2015-10-24 NOTE — ED Provider Notes (Signed)
Physician/Midlevel provider first contact with patient: 10/23/15 1610         History     Chief Complaint   Patient presents with   . Eye Drainage     The history is provided by the patient.   Timing: few days  Eye irritation  Location: L  Associated with redness, wateriness initially and now discharge  Pertinent negatives: no associated fever, URI s/s, denies concern for FB or vision changes  Modifying: does not wear contacts or make up, was around someone with pink eye prior  Severity: moderate  Alleviating: none    History reviewed. No pertinent past medical history.    History reviewed. No pertinent past surgical history.    Family History   Problem Relation Age of Onset   . No known problems Mother    . No known problems Father    . Bone cancer Paternal Uncle    . Liver cancer Paternal Uncle    . Hypertension Maternal Grandmother        Social  Social History   Substance Use Topics   . Smoking status: Never Smoker    . Smokeless tobacco: Never Used   . Alcohol Use: No      Comment: rarely        .     Allergies   Allergen Reactions   . Penicillins Swelling       Home Medications     Last Medication Reconciliation Action:  In Progress Benny Lennert, RN 10/23/2015  7:20 AM                  levonorgestrel (MIRENA) 20 MCG/24HR IUD     1 each by Intrauterine route once. Placed May 2014                               Review of Systems   Constitutional: Negative for fever.   HENT: Negative for congestion and sore throat.    Eyes: Positive for discharge, redness and itching. Negative for photophobia, pain and visual disturbance.   Respiratory: Negative for cough and shortness of breath.    Cardiovascular: Negative for chest pain.   Gastrointestinal: Negative for vomiting, abdominal pain and diarrhea.   Genitourinary: Negative for dysuria and hematuria.   Musculoskeletal: Negative for back pain.   Skin: Negative for rash.   Neurological: Negative for syncope and headaches.   Hematological: Does not bruise/bleed  easily.       Physical Exam    BP: 133/63 mmHg, Heart Rate: 77, Temp: 97.7 F (36.5 C), Resp Rate: 15, SpO2: 98 %, Weight: 90.719 kg    Physical Exam   Constitutional: She is oriented to person, place, and time. She appears well-developed. No distress.   HENT:   Nose: Nose normal.   Mouth/Throat: Oropharynx is clear and moist.   Eyes: Pupils are equal, round, and reactive to light.   L conjunctiva injected, watery, + discharge yellow white opaque  R conjunctiva WNL   Neck: Neck supple.   Cardiovascular: Normal rate and regular rhythm.    Pulmonary/Chest: Effort normal and breath sounds normal.   Abdominal: Soft. There is no tenderness.   Lymphadenopathy:     She has no cervical adenopathy.   Neurological: She is alert and oriented to person, place, and time.   Gross motor intact     Skin: Skin is warm. No rash noted.   Psychiatric: She has a  normal mood and affect. Thought content normal.   Nursing note and vitals reviewed.        MDM and ED Course     ED Medication Orders     None             MDM  Number of Diagnoses or Management Options  Acute conjunctivitis of left eye, unspecified acute conjunctivitis type:   Diagnosis management comments: IRoderic Palau MD, have been the primary provider for Melinda Gonzalez during this Emergency Dept visit.    Dx conjunctivitis. Likely viral question early bacterial transformation. Rx oflox ophthalmic            Procedures    Clinical Impression & Disposition     Clinical Impression  Final diagnoses:   Acute conjunctivitis of left eye, unspecified acute conjunctivitis type        ED Disposition     Discharge Melinda Gonzalez discharge to home/self care.    Condition at disposition: Stable             Discharge Medication List as of 10/23/2015  7:37 AM

## 2016-11-14 ENCOUNTER — Ambulatory Visit (INDEPENDENT_AMBULATORY_CARE_PROVIDER_SITE_OTHER): Payer: Self-pay | Admitting: Obstetrics & Gynecology

## 2016-12-17 ENCOUNTER — Other Ambulatory Visit (INDEPENDENT_AMBULATORY_CARE_PROVIDER_SITE_OTHER): Payer: Self-pay | Admitting: Family Medicine

## 2017-03-11 NOTE — L&D Delivery Note (Signed)
Delivery Summary for Melinda Gonzalez,  No LMP recorded. Patient is pregnant., Estimated Date of Delivery: 03/07/18    Pt admitted at: 02/13/2018  6:03 PM    Patient Active Problem List   Diagnosis   . Hip sprain, left, initial encounter   . Left wrist sprain, initial encounter   . Thoracic back sprain, initial encounter   . Low back sprain, initial encounter   . Cervical sprain, initial encounter   . IUGR (intrauterine growth restriction) affecting care of mother, third trimester, not applicable or unspecified fetus   . [redacted] weeks gestation of pregnancy       Time of Delivery: 1401  02/14/2018     Obstetrician:   Cristy Hilts, MD    Procedure: Spontaneous vaginal delivery    A female  infant was delivered from LOA CAN X1 presentation.    Infant Wt:  3 lb 15 oz (1786 g)         Apgars:  8  at and 9  at 5 minutes    ROM Time: 1140 02/14/18  Amniotic Fluid: Clear       Placenta and Cord:          Mechanism:       Description:      Episiotomy:       Lacerations:      Laceration Type:      Episiotomy/Laceration Repair: none     Estimated Blood Loss:   100 cc's        Specimens: none           Complications:  precipitous delivery    [employer] Melinda Gonzalez

## 2017-07-10 ENCOUNTER — Inpatient Hospital Stay (HOSPITAL_COMMUNITY): Payer: Self-pay

## 2017-07-10 ENCOUNTER — Encounter (HOSPITAL_COMMUNITY): Payer: Self-pay | Admitting: *Deleted

## 2017-07-10 ENCOUNTER — Other Ambulatory Visit: Payer: Self-pay

## 2017-07-10 ENCOUNTER — Inpatient Hospital Stay (HOSPITAL_COMMUNITY)
Admission: AD | Admit: 2017-07-10 | Discharge: 2017-07-10 | Disposition: A | Discharge status: Home / Self Care | Payer: Self-pay | Source: Ambulatory Visit | Attending: Obstetrics & Gynecology | Admitting: Obstetrics & Gynecology

## 2017-07-10 DIAGNOSIS — N760 Acute vaginitis: Secondary | ICD-10-CM

## 2017-07-10 DIAGNOSIS — O26899 Other specified pregnancy related conditions, unspecified trimester: Secondary | ICD-10-CM

## 2017-07-10 DIAGNOSIS — O26891 Other specified pregnancy related conditions, first trimester: Secondary | ICD-10-CM

## 2017-07-10 DIAGNOSIS — R109 Unspecified abdominal pain: Secondary | ICD-10-CM

## 2017-07-10 DIAGNOSIS — O23591 Infection of other part of genital tract in pregnancy, first trimester: Secondary | ICD-10-CM | POA: Insufficient documentation

## 2017-07-10 DIAGNOSIS — Z3A01 Less than 8 weeks gestation of pregnancy: Secondary | ICD-10-CM | POA: Insufficient documentation

## 2017-07-10 DIAGNOSIS — Z3490 Encounter for supervision of normal pregnancy, unspecified, unspecified trimester: Secondary | ICD-10-CM

## 2017-07-10 DIAGNOSIS — B9689 Other specified bacterial agents as the cause of diseases classified elsewhere: Secondary | ICD-10-CM | POA: Insufficient documentation

## 2017-07-10 HISTORY — DX: Depression, unspecified: F32.A

## 2017-07-10 HISTORY — DX: Major depressive disorder, single episode, unspecified: F32.9

## 2017-07-10 HISTORY — DX: Headache: R51

## 2017-07-10 HISTORY — DX: Headache, unspecified: R51.9

## 2017-07-10 HISTORY — DX: Unspecified abnormal cytological findings in specimens from vagina: R87.629

## 2017-07-10 HISTORY — DX: Unspecified infectious disease: B99.9

## 2017-07-10 HISTORY — DX: Anxiety disorder, unspecified: F41.9

## 2017-07-10 HISTORY — DX: Cardiac murmur, unspecified: R01.1

## 2017-07-10 LAB — WET PREP, GENITAL
SPERM: NONE SEEN
TRICH WET PREP: NONE SEEN
Yeast Wet Prep HPF POC: NONE SEEN

## 2017-07-10 LAB — URINALYSIS, ROUTINE W REFLEX MICROSCOPIC
Bilirubin Urine: NEGATIVE
GLUCOSE, UA: NEGATIVE mg/dL
Ketones, ur: NEGATIVE mg/dL
Leukocytes, UA: NEGATIVE
NITRITE: NEGATIVE
PH: 6 (ref 5.0–8.0)
Protein, ur: NEGATIVE mg/dL
SPECIFIC GRAVITY, URINE: 1.024 (ref 1.005–1.030)

## 2017-07-10 LAB — ABO/RH: ABO/RH(D): B POS

## 2017-07-10 LAB — CBC
HEMATOCRIT: 37.7 % (ref 36.0–46.0)
HEMOGLOBIN: 12.4 g/dL (ref 12.0–15.0)
MCH: 27.9 pg (ref 26.0–34.0)
MCHC: 32.9 g/dL (ref 30.0–36.0)
MCV: 84.7 fL (ref 78.0–100.0)
Platelets: 269 10*3/uL (ref 150–400)
RBC: 4.45 MIL/uL (ref 3.87–5.11)
RDW: 13.7 % (ref 11.5–15.5)
WBC: 15 10*3/uL — ABNORMAL HIGH (ref 4.0–10.5)

## 2017-07-10 LAB — COMPREHENSIVE METABOLIC PANEL
ALBUMIN: 4.1 g/dL (ref 3.5–5.0)
ALK PHOS: 50 U/L (ref 38–126)
ALT: 21 U/L (ref 14–54)
ANION GAP: 9 (ref 5–15)
AST: 23 U/L (ref 15–41)
BILIRUBIN TOTAL: 0.3 mg/dL (ref 0.3–1.2)
BUN: 9 mg/dL (ref 6–20)
CALCIUM: 9.4 mg/dL (ref 8.9–10.3)
CO2: 26 mmol/L (ref 22–32)
Chloride: 103 mmol/L (ref 101–111)
Creatinine, Ser: 0.53 mg/dL (ref 0.44–1.00)
GFR calc Af Amer: >60 mL/min (ref >60)
GLUCOSE: 108 mg/dL — AB (ref 65–99)
POTASSIUM: 4.4 mmol/L (ref 3.5–5.1)
Sodium: 138 mmol/L (ref 135–145)
TOTAL PROTEIN: 7.4 g/dL (ref 6.5–8.1)

## 2017-07-10 LAB — POCT PREGNANCY, URINE: Preg Test, Ur: POSITIVE — AB

## 2017-07-10 LAB — HCG, QUANTITATIVE, PREGNANCY: hCG, Beta Chain, Quant, S: 16917 m[IU]/mL — ABNORMAL HIGH (ref <5)

## 2017-07-10 MED ORDER — METRONIDAZOLE 500 MG PO TABS: 500.0000 mg | ORAL_TABLET | Freq: Two times a day (BID) | ORAL | 0 refills | Status: AC

## 2017-07-10 NOTE — Discharge Instructions (Signed)
First Trimester of Pregnancy The first trimester of pregnancy is from week 1 until the end of week 13 (months 1 through 3). During this time, your baby will begin to develop inside you. At 6-8 weeks, the eyes and face are formed, and the heartbeat can be seen on ultrasound. At the end of 12 weeks, all the baby's organs are formed. Prenatal care is all the medical care you receive before the birth of your baby. Make sure you get good prenatal care and follow all of your doctor's instructions. Follow these instructions at home: Medicines  Take over-the-counter and prescription medicines only as told by your doctor. Some medicines are safe and some medicines are not safe during pregnancy.  Take a prenatal vitamin that contains at least 600 micrograms (mcg) of folic acid.  If you have trouble pooping (constipation), take medicine that will make your stool soft (stool softener) if your doctor approves. Eating and drinking  Eat regular, healthy meals.  Your doctor will tell you the amount of weight gain that is right for you.  Avoid raw meat and uncooked cheese.  If you feel sick to your stomach (nauseous) or throw up (vomit): ? Eat 4 or 5 small meals a day instead of 3 large meals. ? Try eating a few soda crackers. ? Drink liquids between meals instead of during meals.  To prevent constipation: ? Eat foods that are high in fiber, like fresh fruits and vegetables, whole grains, and beans. ? Drink enough fluids to keep your pee (urine) clear or pale yellow. Activity  Exercise only as told by your doctor. Stop exercising if you have cramps or pain in your lower belly (abdomen) or low back.  Do not exercise if it is too hot, too humid, or if you are in a place of great height (high altitude).  Try to avoid standing for long periods of time. Move your legs often if you must stand in one place for a long time.  Avoid heavy lifting.  Wear low-heeled shoes. Sit and stand up straight.  You  can have sex unless your doctor tells you not to. Relieving pain and discomfort  Wear a good support bra if your breasts are sore.  Take warm water baths (sitz baths) to soothe pain or discomfort caused by hemorrhoids. Use hemorrhoid cream if your doctor says it is okay.  Rest with your legs raised if you have leg cramps or low back pain.  If you have puffy, bulging veins (varicose veins) in your legs: ? Wear support hose or compression stockings as told by your doctor. ? Raise (elevate) your feet for 15 minutes, 3-4 times a day. ? Limit salt in your food. Prenatal care  Schedule your prenatal visits by the twelfth week of pregnancy.  Write down your questions. Take them to your prenatal visits.  Keep all your prenatal visits as told by your doctor. This is important. Safety  Wear your seat belt at all times when driving.  Make a list of emergency phone numbers. The list should include numbers for family, friends, the hospital, and police and fire departments. General instructions  Ask your doctor for a referral to a local prenatal class. Begin classes no later than at the start of month 6 of your pregnancy.  Ask for help if you need counseling or if you need help with nutrition. Your doctor can give you advice or tell you where to go for help.  Do not use hot tubs, steam rooms, or   saunas.  Do not douche or use tampons or scented sanitary pads.  Do not cross your legs for long periods of time.  Avoid all herbs and alcohol. Avoid drugs that are not approved by your doctor.  Do not use any tobacco products, including cigarettes, chewing tobacco, and electronic cigarettes. If you need help quitting, ask your doctor. You may get counseling or other support to help you quit.  Avoid cat litter boxes and soil used by cats. These carry germs that can cause birth defects in the baby and can cause a loss of your baby (miscarriage) or stillbirth.  Visit your dentist. At home, brush  your teeth with a soft toothbrush. Be gentle when you floss. Contact a doctor if:  You are dizzy.  You have mild cramps or pressure in your lower belly.  You have a nagging pain in your belly area.  You continue to feel sick to your stomach, you throw up, or you have watery poop (diarrhea).  You have a bad smelling fluid coming from your vagina.  You have pain when you pee (urinate).  You have increased puffiness (swelling) in your face, hands, legs, or ankles. Get help right away if:  You have a fever.  You are leaking fluid from your vagina.  You have spotting or bleeding from your vagina.  You have very bad belly cramping or pain.  You gain or lose weight rapidly.  You throw up blood. It may look like coffee grounds.  You are around people who have German measles, fifth disease, or chickenpox.  You have a very bad headache.  You have shortness of breath.  You have any kind of trauma, such as from a fall or a car accident. Summary  The first trimester of pregnancy is from week 1 until the end of week 13 (months 1 through 3).  To take care of yourself and your unborn baby, you will need to eat healthy meals, take medicines only if your doctor tells you to do so, and do activities that are safe for you and your baby.  Keep all follow-up visits as told by your doctor. This is important as your doctor will have to ensure that your baby is healthy and growing well. This information is not intended to replace advice given to you by your health care provider. Make sure you discuss any questions you have with your health care provider. Document Released: 08/14/2007 Document Revised: 03/05/2016 Document Reviewed: 03/05/2016 Elsevier Interactive Patient Education  2017 Elsevier Inc[employer]  

## 2017-07-10 NOTE — MAU Provider Note (Signed)
Patient  Denise [name] is a 32 y.o. G3P2002 At [redacted]w[redacted]d here with complaints of abdominal pain for 1 week. She denies bleeding, vaginal discharge, dysuria. She thinks she is 5 weeks 5 days based on LMP.  History     CSN: [id number]  Arrival date and time: 07/10/17 1754   None     Chief Complaint  Patient presents with  . Abdominal Pain   Abdominal Pain  This is a new problem. The current episode started today. The onset quality is sudden. The problem occurs intermittently. The problem has been unchanged. The pain is at a severity of 6/10. The quality of the pain is cramping. Pertinent negatives include no constipation, diarrhea, dysuria, nausea or vomiting. Nothing aggravates the pain. The pain is relieved by nothing.    OB History    Gravida  3   Para  2   Term  2   Preterm  0   AB  0   Living  2     SAB      TAB      Ectopic      Multiple      Live Births  2           Past Medical History:  Diagnosis Date  . Anxiety   . Depression    doing ok  . Headache   . Heart murmur   . Infection    UTI  . Vaginal Pap smear, abnormal    cryo    Past Surgical History:  Procedure Laterality Date  . CRYOTHERAPY      Family History  Problem Relation Age of Onset  . Hypertension Father   . Diabetes Maternal Grandmother   . Hypertension Maternal Grandmother   . Kidney disease Maternal Grandfather   . Diabetes Maternal Grandfather     Social History   Tobacco Use  . Smoking status: Never Smoker  . Smokeless tobacco: Never Used  Substance Use Topics  . Alcohol use: Not Currently    Comment: rare  . Drug use: Never    Allergies: Not on File  No medications prior to admission.    Review of Systems  Constitutional: Negative.   HENT: Negative.   Respiratory: Negative.   Cardiovascular: Negative.   Gastrointestinal: Positive for abdominal pain. Negative for constipation, diarrhea, nausea and vomiting.  Endocrine: Negative.   Genitourinary:  Negative.  Negative for dysuria.  Musculoskeletal: Negative.   Skin: Negative.   Neurological: Negative.   Hematological: Negative.   Psychiatric/Behavioral: Negative.    Physical Exam   Blood pressure 116/71, pulse 86, temperature 98.6 F (37 C), resp. rate 18, height  (1.727 m), weight 222 lb (100.7 kg), last menstrual period 05/31/2017.  Physical Exam  Constitutional: She appears well-developed.  HENT:  Head: Normocephalic.  Eyes: Pupils are equal, round, and reactive to light.  Neck: Normal range of motion.  GI: Soft. Bowel sounds are normal.  Genitourinary:  Genitourinary Comments: Normal external female genitalia; no CMT, no suprapubic tenderness, no adnexal tenderness. S=D.    Musculoskeletal: Normal range of motion.  Neurological: She is alert.  Skin: Skin is warm and dry.    MAU Course  Procedures  MDM -CBC: normal -CMP: normal -ABO:  B pos -beta quantitative: 16K -wet prep: positive for clue cells -US shows YS but no fetal pole -UC pending given blood in urine  Assessment and Plan   1. Bacterial vaginosis   2. Abdominal pain affecting pregnancy   3. Intrauterine pregnancy  2. FLagyl RX given 3. Repeat US in two weeks to establish dating and viability.  4. Return to MAU if bleeding, increased pain, dysuria.   Charlesetta Garibaldi Kooistra 07/10/2017, 7:55 PM

## 2017-07-10 NOTE — MAU Note (Signed)
Pt reports she has had lower abd pain and cramping x 1 week. Was late for period and took HPT and it was positive

## 2017-07-11 LAB — GC/CHLAMYDIA PROBE AMP (~~LOC~~) NOT AT ARMC
Chlamydia: NEGATIVE
NEISSERIA GONORRHEA: NEGATIVE

## 2017-08-08 ENCOUNTER — Inpatient Hospital Stay (HOSPITAL_COMMUNITY)
Admission: AD | Admit: 2017-08-08 | Discharge: 2017-08-08 | Disposition: A | Discharge status: Home / Self Care | Payer: Managed Care, Other (non HMO) | Source: Ambulatory Visit | Attending: Obstetrics and Gynecology | Admitting: Obstetrics and Gynecology

## 2017-08-08 ENCOUNTER — Other Ambulatory Visit: Payer: Self-pay

## 2017-08-08 ENCOUNTER — Encounter (HOSPITAL_COMMUNITY): Payer: Self-pay | Admitting: *Deleted

## 2017-08-08 ENCOUNTER — Inpatient Hospital Stay (HOSPITAL_COMMUNITY): Payer: Managed Care, Other (non HMO)

## 2017-08-08 DIAGNOSIS — Z3A09 9 weeks gestation of pregnancy: Secondary | ICD-10-CM | POA: Insufficient documentation

## 2017-08-08 DIAGNOSIS — O418X10 Other specified disorders of amniotic fluid and membranes, first trimester, not applicable or unspecified: Secondary | ICD-10-CM

## 2017-08-08 DIAGNOSIS — R109 Unspecified abdominal pain: Secondary | ICD-10-CM | POA: Insufficient documentation

## 2017-08-08 DIAGNOSIS — O26899 Other specified pregnancy related conditions, unspecified trimester: Secondary | ICD-10-CM

## 2017-08-08 DIAGNOSIS — O468X1 Other antepartum hemorrhage, first trimester: Secondary | ICD-10-CM | POA: Diagnosis not present

## 2017-08-08 DIAGNOSIS — O99341 Other mental disorders complicating pregnancy, first trimester: Secondary | ICD-10-CM | POA: Diagnosis not present

## 2017-08-08 DIAGNOSIS — O209 Hemorrhage in early pregnancy, unspecified: Secondary | ICD-10-CM

## 2017-08-08 DIAGNOSIS — O26891 Other specified pregnancy related conditions, first trimester: Secondary | ICD-10-CM | POA: Diagnosis not present

## 2017-08-08 DIAGNOSIS — F419 Anxiety disorder, unspecified: Secondary | ICD-10-CM | POA: Diagnosis not present

## 2017-08-08 DIAGNOSIS — F329 Major depressive disorder, single episode, unspecified: Secondary | ICD-10-CM | POA: Diagnosis not present

## 2017-08-08 DIAGNOSIS — O208 Other hemorrhage in early pregnancy: Secondary | ICD-10-CM | POA: Insufficient documentation

## 2017-08-08 LAB — URINALYSIS, ROUTINE W REFLEX MICROSCOPIC
Bilirubin Urine: NEGATIVE
GLUCOSE, UA: NEGATIVE mg/dL
Hgb urine dipstick: NEGATIVE
KETONES UR: NEGATIVE mg/dL
LEUKOCYTES UA: NEGATIVE
Nitrite: NEGATIVE
PH: 6 (ref 5.0–8.0)
PROTEIN: NEGATIVE mg/dL
Specific Gravity, Urine: 1.017 (ref 1.005–1.030)

## 2017-08-08 LAB — GONOCOCCUS CULTURE
Chlamydia trachomatis Culture: NEGATIVE
Culture Gonorrhoeae: NEGATIVE

## 2017-08-08 LAB — SYPHILIS SCREEN IGG AND IGM
Syphilis Screen IgG and IgM: NONREACTIVE
Syphilis Screen IgG and IgM: NONREACTIVE

## 2017-08-08 LAB — RPR: RPR: NONREACTIVE

## 2017-08-08 LAB — ANTIBODY SCREEN: AB Screen Gel: NEGATIVE

## 2017-08-08 LAB — HEPATITIS B SURFACE ANTIGEN W/ REFLEX TO CONFIRMATION: Hepatitis B Surface Antigen: NEGATIVE

## 2017-08-08 LAB — RUBELLA ANTIBODY, IGG: Rubella AB, IgG: IMMUNE

## 2017-08-08 LAB — HIV AG/AB 4TH GENERATION: HIV Ag/Ab, 4th Generation: NEGATIVE

## 2017-08-08 NOTE — Discharge Instructions (Signed)

## 2017-08-08 NOTE — MAU Provider Note (Signed)
History     CSN: [id number]  Arrival date and time: 08/08/17 1631   First [id number] Contact with Patient 08/08/17 1727      Chief Complaint  Patient presents with  . Vaginal Bleeding  . Abdominal Pain   HPI  Ms.  Denise [name] is a 32 y.o. year old G32P2002 female at [redacted]w[redacted]d weeks gestation who was sent to MAU after attending her initial prenatal visit with a Charity fundraiser at [facility]. While there she complained of spotting and mild cramping. Per patient, there was no U/S tech available, so they told her to come here for evaluation. She reports 1 episode of bleeding this morning, but none now. She did not wear a pad today.   Past Medical History:  Diagnosis Date  . Anxiety   . Depression    doing ok  . Headache   . Heart murmur   . Infection    UTI  . Vaginal Pap smear, abnormal    cryo    Past Surgical History:  Procedure Laterality Date  . CRYOTHERAPY      Family History  Problem Relation Age of Onset  . Hypertension Father   . Diabetes Maternal Grandmother   . Hypertension Maternal Grandmother   . Kidney disease Maternal Grandfather   . Diabetes Maternal Grandfather     Social History   Tobacco Use  . Smoking status: Never Smoker  . Smokeless tobacco: Never Used  Substance Use Topics  . Alcohol use: Not Currently    Comment: rare  . Drug use: Never    Allergies:  Allergies  Allergen Reactions  . Penicillins Swelling    Has patient had a PCN reaction causing immediate rash, facial/tongue/throat swelling, SOB or lightheadedness with hypotension: Unknown Has patient had a PCN reaction causing severe rash involving mucus membranes or skin necrosis: Unknown Has patient had a PCN reaction that required hospitalization: Yes Has patient had a PCN reaction occurring within the last 10 years: No If all of the above answers are "NO", then may proceed with Cephalosporin use.     Medications Prior to Admission  Medication Sig Dispense Refill Last Dose  .  Prenatal Vit-Fe Fumarate-FA (PRENATAL MULTIVITAMIN) TABS tablet Take 1 tablet by mouth daily at 12 noon.   08/08/2017 at Unknown time  . metroNIDAZOLE (FLAGYL) 500 MG tablet Take 1 tablet (500 mg total) by mouth 2 (two) times daily. (Patient not taking: Reported on 08/08/2017) 14 tablet 0 Not Taking at Unknown time    Review of Systems  Constitutional: Negative.   HENT: Negative.   Eyes: Negative.   Respiratory: Negative.   Cardiovascular: Negative.   Gastrointestinal: Negative.   Endocrine: Negative.   Genitourinary: Positive for pelvic pain (lower pelvic cramping) and vaginal bleeding.  Musculoskeletal: Negative.   Skin: Negative.   Allergic/Immunologic: Negative.   Neurological: Negative.   Hematological: Negative.   Psychiatric/Behavioral: Negative.    Physical Exam   Blood pressure 124/70, pulse 82, temperature 98.4 F (36.9 C), temperature source Oral, resp. rate 16, weight 226 lb 12 oz (102.9 kg), last menstrual period 05/31/2017, SpO2 100 %.  Physical Exam  Nursing note and vitals reviewed. Constitutional: She is oriented to person, place, and time. She appears well-developed and well-nourished.  HENT:  Head: Normocephalic and atraumatic.  Eyes: Pupils are equal, round, and reactive to light.  Neck: Normal range of motion.  Cardiovascular: Normal rate, regular rhythm and normal heart sounds.  Respiratory: Effort normal and breath sounds normal.  GI: Soft.  Bowel sounds are normal.  Genitourinary:  Genitourinary Comments: Uterus: non-tender, S=D, SE: cervix is smooth, pink, no lesions, moderate amt of thick, white vaginal d/c, VE: closed/long/firm, no CMT or friability, no adnexal tenderness  Musculoskeletal: Normal range of motion.  Neurological: She is alert and oriented to person, place, and time.  Skin: Skin is warm and dry.  Psychiatric: She has a normal mood and affect. Her behavior is normal. Judgment and thought content normal.    MAU Course   Procedures  MDM Speculum Exam OB U/S <14 wks with TV  *Consult with Dr. Mora Appl @ 1912 - notified of patient's complaints, assessments, lab & U/S results, tx plan d/c home with [facility] precautions, f/u with office prn - ok to d/c home, agrees with plan   US Ob Comp Less 14 Wks  Result Date: 08/08/2017 CLINICAL DATA:  Bleeding in early pregnancy EXAM: OBSTETRIC <14 WK ULTRASOUND TECHNIQUE: Transabdominal ultrasound was performed for evaluation of the gestation as well as the maternal uterus and adnexal regions. COMPARISON:  None. FINDINGS: Intrauterine gestational sac: Single Yolk sac:  Visualized. Embryo:  Visualized. Cardiac Activity: Visualized. Heart Rate: 164 bpm CRL:   28.0 mm   9 w 4 d        [place] EDC: 03/09/2018 Subchorionic hemorrhage:  Small subchronic hemorrhage. Maternal uterus/adnexae: Right ovaries within normal limits. Left ovary is not discretely visualized. No free fluid. IMPRESSION: Single live intrauterine gestation, with estimated gestational age [redacted] weeks 4 days by crown-rump length, as above. Electronically Signed   By: Charline Bills M.D.   On: 08/08/2017 18:03    Assessment and Plan  Subchorionic hematoma in first trimester, single or unspecified fetus - Discussed [facility] with patient -Information provided on [facility] - Advised for pelvic rest until further notice from [facility] provider   Abdominal pain in pregnancy - Ok to take Tylenol 1000 mg po - Information provided on abdominal pain in pregnancy  Vaginal bleeding affecting early pregnancy  - Bleeding precautions reviewed - Discharge patient - Keep scheduled appt with [facility]  Raelyn Mora, MSN, CNM 08/08/2017, 5:28 PM

## 2017-08-08 NOTE — MAU Note (Signed)
Had first prenatal visit today.  Told her about the bleeding, they did not have [place] available, so was told to come here.  Has been having pain.  Had some bleeding this morning, was 1st episode, has not worn a pad todaty

## 2017-09-18 ENCOUNTER — Other Ambulatory Visit (HOSPITAL_COMMUNITY)
Admission: RE | Admit: 2017-09-18 | Discharge: 2017-09-18 | Disposition: A | Discharge status: Home / Self Care | Payer: Managed Care, Other (non HMO) | Source: Ambulatory Visit | Attending: Obstetrics and Gynecology | Admitting: Obstetrics and Gynecology

## 2017-09-18 ENCOUNTER — Other Ambulatory Visit: Payer: Self-pay | Admitting: Obstetrics and Gynecology

## 2017-09-18 DIAGNOSIS — Z01411 Encounter for gynecological examination (general) (routine) with abnormal findings: Secondary | ICD-10-CM | POA: Diagnosis present

## 2017-09-19 LAB — CYTOLOGY - PAP
Chlamydia: NEGATIVE
Diagnosis: NEGATIVE
HPV: NOT DETECTED
NEISSERIA GONORRHEA: NEGATIVE

## 2017-09-23 ENCOUNTER — Other Ambulatory Visit: Payer: Self-pay

## 2017-10-03 ENCOUNTER — Other Ambulatory Visit (HOSPITAL_COMMUNITY): Payer: Self-pay | Admitting: Obstetrics and Gynecology

## 2017-10-03 DIAGNOSIS — Z3A18 18 weeks gestation of pregnancy: Secondary | ICD-10-CM

## 2017-10-03 DIAGNOSIS — O280 Abnormal hematological finding on antenatal screening of mother: Secondary | ICD-10-CM

## 2017-10-07 ENCOUNTER — Encounter (HOSPITAL_COMMUNITY): Payer: Self-pay

## 2017-10-10 ENCOUNTER — Encounter (HOSPITAL_COMMUNITY): Payer: Managed Care, Other (non HMO)

## 2017-10-10 ENCOUNTER — Ambulatory Visit (HOSPITAL_COMMUNITY): Payer: Self-pay

## 2017-10-10 ENCOUNTER — Ambulatory Visit (HOSPITAL_BASED_OUTPATIENT_CLINIC_OR_DEPARTMENT_OTHER)
Admission: RE | Admit: 2017-10-10 | Discharge: 2017-10-10 | Disposition: A | Discharge status: Home / Self Care | Payer: BLUE CROSS/BLUE SHIELD | Source: Ambulatory Visit | Attending: Obstetrics and Gynecology | Admitting: Obstetrics and Gynecology

## 2017-10-10 ENCOUNTER — Encounter (HOSPITAL_COMMUNITY): Payer: Self-pay

## 2017-10-10 ENCOUNTER — Other Ambulatory Visit (HOSPITAL_COMMUNITY): Payer: Self-pay | Admitting: Obstetrics and Gynecology

## 2017-10-10 ENCOUNTER — Ambulatory Visit (HOSPITAL_COMMUNITY)
Admission: RE | Admit: 2017-10-10 | Discharge: 2017-10-10 | Disposition: A | Discharge status: Home / Self Care | Payer: BLUE CROSS/BLUE SHIELD | Source: Ambulatory Visit | Attending: Obstetrics and Gynecology | Admitting: Obstetrics and Gynecology

## 2017-10-10 ENCOUNTER — Other Ambulatory Visit (HOSPITAL_COMMUNITY): Payer: Self-pay | Admitting: *Deleted

## 2017-10-10 DIAGNOSIS — Z363 Encounter for antenatal screening for malformations: Secondary | ICD-10-CM | POA: Diagnosis not present

## 2017-10-10 DIAGNOSIS — O280 Abnormal hematological finding on antenatal screening of mother: Secondary | ICD-10-CM | POA: Insufficient documentation

## 2017-10-10 DIAGNOSIS — Z3A18 18 weeks gestation of pregnancy: Secondary | ICD-10-CM

## 2017-10-10 DIAGNOSIS — O281 Abnormal biochemical finding on antenatal screening of mother: Secondary | ICD-10-CM | POA: Diagnosis not present

## 2017-10-10 DIAGNOSIS — O289 Unspecified abnormal findings on antenatal screening of mother: Secondary | ICD-10-CM | POA: Diagnosis not present

## 2017-10-10 DIAGNOSIS — R772 Abnormality of alphafetoprotein: Secondary | ICD-10-CM

## 2017-10-10 NOTE — Consult Note (Addendum)
Maternal-Fetal Medicine Consultation  Patient's name: Denise [name] MR number: [id number] DOB: [birth year] Referring provider: Gerald Leitzara Cole., MD Consultation date: 10/10/2017  I had the pleasure of seeing Denise [name] at [facility].  She is here for ultrasound and consultation.  On serum screening the risk of open spina bifida was increased at 1: 288.  MSAFP was 2.88 MoM.  Patient reports she did not have any vaginal bleeding. Obstetric history is significant for 2 term vaginal deliveries 5 and 10 years ago.  Both her children are in good health. GYN history: History of abnormal Pap smear.  History of cryosurgery. Past medical history: No history of diabetes hypertension or any chronic medical conditions. Past surgical history: Nil of note. Medications: Prenatal vitamins. Allergies: Penicillin ("swell up"). Social history: Denies tobacco drug or alcohol use.  Her partner is an African-American and he is in good health.  She is an Airline pilotaccountant in a firm. Family history: No history of venous thromboembolism in the family.  I counseled the patient on the following: Increased risk for open-neural tube defects and increased AFP:  I reassured the patient of normal fetal anatomy on ultrasound including spine and intracranial structures (see ultrasound report). I informed her that ultrasound can detect up to 95 out of 100 cases of spina bifida and that amniocentesis will only marginally improve the detection rate. I also reassured her that no abdominal wall defects (gastroschisis or omphalocele) is seen.  Increased AFP can also be associated with fetal growth restriction (placental insufficiency), preterm delivery and stillbirth (very rare). I recommended serial fetal growth assessments. It can also follow vaginal bleeding. Patient had genetic counseling after ultrasound.  After counseling the patient opted not to have amniocentesis.  We recommend serial fetal growth  assessments every 4 weeks till delivery.  Thank you for your consult.  If you have any questions or concerns please do not hesitate to contact me at the [facility].  Consultation including face-to-face counseling 30 minutes.

## 2017-10-10 NOTE — ED Notes (Signed)
Patient in session with GC.

## 2017-10-13 LAB — HEMOGLOBINOPATHY EVALUATION
HGB A: 97.7 % (ref 96.4–98.8)
HGB C: 0 %
HGB VARIANT: 0 %
Hgb A2 Quant: 2.3 % (ref 1.8–3.2)
Hgb F Quant: 0 % (ref 0.0–2.0)
Hgb S Quant: 0 %

## 2017-10-22 ENCOUNTER — Encounter (HOSPITAL_COMMUNITY): Payer: Managed Care, Other (non HMO)

## 2017-10-22 ENCOUNTER — Ambulatory Visit (HOSPITAL_COMMUNITY): Payer: Managed Care, Other (non HMO)

## 2017-10-28 ENCOUNTER — Other Ambulatory Visit (HOSPITAL_COMMUNITY): Payer: Self-pay

## 2017-11-21 ENCOUNTER — Encounter (HOSPITAL_COMMUNITY): Payer: Self-pay

## 2017-11-21 ENCOUNTER — Ambulatory Visit (HOSPITAL_COMMUNITY)
Admission: RE | Admit: 2017-11-21 | Discharge: 2017-11-21 | Disposition: A | Discharge status: Home / Self Care | Payer: Medicaid Other | Source: Ambulatory Visit | Attending: Obstetrics and Gynecology | Admitting: Obstetrics and Gynecology

## 2017-11-21 ENCOUNTER — Other Ambulatory Visit (HOSPITAL_COMMUNITY): Payer: Self-pay | Admitting: *Deleted

## 2017-11-21 DIAGNOSIS — O289 Unspecified abnormal findings on antenatal screening of mother: Secondary | ICD-10-CM | POA: Insufficient documentation

## 2017-11-21 DIAGNOSIS — Z362 Encounter for other antenatal screening follow-up: Secondary | ICD-10-CM | POA: Diagnosis present

## 2017-11-21 DIAGNOSIS — O281 Abnormal biochemical finding on antenatal screening of mother: Secondary | ICD-10-CM

## 2017-11-21 DIAGNOSIS — R772 Abnormality of alphafetoprotein: Secondary | ICD-10-CM | POA: Diagnosis present

## 2017-11-21 DIAGNOSIS — Z3A24 24 weeks gestation of pregnancy: Secondary | ICD-10-CM | POA: Diagnosis not present

## 2017-12-19 ENCOUNTER — Other Ambulatory Visit (HOSPITAL_COMMUNITY): Payer: Self-pay | Admitting: *Deleted

## 2017-12-19 ENCOUNTER — Ambulatory Visit (HOSPITAL_COMMUNITY)
Admission: RE | Admit: 2017-12-19 | Discharge: 2017-12-19 | Disposition: A | Discharge status: Home / Self Care | Payer: Medicaid Other | Source: Ambulatory Visit | Attending: Obstetrics and Gynecology | Admitting: Obstetrics and Gynecology

## 2017-12-19 ENCOUNTER — Encounter (HOSPITAL_COMMUNITY): Payer: Self-pay

## 2017-12-19 ENCOUNTER — Other Ambulatory Visit (HOSPITAL_COMMUNITY): Payer: Self-pay | Admitting: Obstetrics and Gynecology

## 2017-12-19 DIAGNOSIS — Z362 Encounter for other antenatal screening follow-up: Secondary | ICD-10-CM | POA: Insufficient documentation

## 2017-12-19 DIAGNOSIS — O281 Abnormal biochemical finding on antenatal screening of mother: Secondary | ICD-10-CM

## 2017-12-19 DIAGNOSIS — Z3A28 28 weeks gestation of pregnancy: Secondary | ICD-10-CM | POA: Insufficient documentation

## 2017-12-19 DIAGNOSIS — O365930 Maternal care for other known or suspected poor fetal growth, third trimester, not applicable or unspecified: Secondary | ICD-10-CM | POA: Insufficient documentation

## 2017-12-19 DIAGNOSIS — O289 Unspecified abnormal findings on antenatal screening of mother: Secondary | ICD-10-CM

## 2017-12-19 DIAGNOSIS — O365990 Maternal care for other known or suspected poor fetal growth, unspecified trimester, not applicable or unspecified: Secondary | ICD-10-CM

## 2017-12-26 ENCOUNTER — Encounter (HOSPITAL_COMMUNITY): Payer: Self-pay

## 2017-12-26 ENCOUNTER — Ambulatory Visit (HOSPITAL_COMMUNITY)
Admission: RE | Admit: 2017-12-26 | Discharge: 2017-12-26 | Disposition: A | Discharge status: Home / Self Care | Payer: Medicaid Other | Source: Ambulatory Visit | Attending: Obstetrics and Gynecology | Admitting: Obstetrics and Gynecology

## 2017-12-26 ENCOUNTER — Other Ambulatory Visit (HOSPITAL_COMMUNITY): Payer: Self-pay | Admitting: *Deleted

## 2017-12-26 DIAGNOSIS — Z362 Encounter for other antenatal screening follow-up: Secondary | ICD-10-CM

## 2017-12-26 DIAGNOSIS — O289 Unspecified abnormal findings on antenatal screening of mother: Secondary | ICD-10-CM

## 2017-12-26 DIAGNOSIS — Z3A29 29 weeks gestation of pregnancy: Secondary | ICD-10-CM | POA: Diagnosis not present

## 2017-12-26 DIAGNOSIS — O365930 Maternal care for other known or suspected poor fetal growth, third trimester, not applicable or unspecified: Secondary | ICD-10-CM

## 2017-12-26 DIAGNOSIS — O365990 Maternal care for other known or suspected poor fetal growth, unspecified trimester, not applicable or unspecified: Secondary | ICD-10-CM

## 2018-01-01 ENCOUNTER — Ambulatory Visit (HOSPITAL_COMMUNITY)
Admission: RE | Admit: 2018-01-01 | Discharge: 2018-01-01 | Disposition: A | Discharge status: Home / Self Care | Payer: Medicaid Other | Source: Ambulatory Visit | Attending: Obstetrics and Gynecology | Admitting: Obstetrics and Gynecology

## 2018-01-01 DIAGNOSIS — O365930 Maternal care for other known or suspected poor fetal growth, third trimester, not applicable or unspecified: Secondary | ICD-10-CM | POA: Insufficient documentation

## 2018-01-01 DIAGNOSIS — Z3A30 30 weeks gestation of pregnancy: Secondary | ICD-10-CM | POA: Insufficient documentation

## 2018-01-01 MED ORDER — BETAMETHASONE SOD PHOS & ACET 6 (3-3) MG/ML IJ SUSP
12.0000 mg | Freq: Once | INTRAMUSCULAR | Status: AC
  Administered 2018-01-01: 12 mg via INTRAMUSCULAR
  Filled 2018-01-01: qty 2

## 2018-01-02 ENCOUNTER — Ambulatory Visit (HOSPITAL_COMMUNITY)
Admission: RE | Admit: 2018-01-02 | Discharge: 2018-01-02 | Disposition: A | Discharge status: Home / Self Care | Payer: Medicaid Other | Source: Ambulatory Visit | Attending: Obstetrics and Gynecology | Admitting: Obstetrics and Gynecology

## 2018-01-02 ENCOUNTER — Encounter (HOSPITAL_COMMUNITY): Payer: Self-pay

## 2018-01-02 DIAGNOSIS — Z3A30 30 weeks gestation of pregnancy: Secondary | ICD-10-CM | POA: Diagnosis not present

## 2018-01-02 DIAGNOSIS — O289 Unspecified abnormal findings on antenatal screening of mother: Secondary | ICD-10-CM | POA: Diagnosis not present

## 2018-01-02 DIAGNOSIS — O365990 Maternal care for other known or suspected poor fetal growth, unspecified trimester, not applicable or unspecified: Secondary | ICD-10-CM

## 2018-01-02 DIAGNOSIS — O365930 Maternal care for other known or suspected poor fetal growth, third trimester, not applicable or unspecified: Secondary | ICD-10-CM | POA: Diagnosis not present

## 2018-01-02 DIAGNOSIS — Z362 Encounter for other antenatal screening follow-up: Secondary | ICD-10-CM

## 2018-01-02 MED ORDER — BETAMETHASONE SOD PHOS & ACET 6 (3-3) MG/ML IJ SUSP
12.0000 mg | Freq: Once | INTRAMUSCULAR | Status: AC
  Administered 2018-01-02: 12 mg via INTRAMUSCULAR
  Filled 2018-01-02: qty 2

## 2018-01-09 ENCOUNTER — Ambulatory Visit (HOSPITAL_COMMUNITY): Admission: RE | Admit: 2018-01-09 | Payer: Medicaid Other | Source: Ambulatory Visit

## 2018-02-11 ENCOUNTER — Ambulatory Visit (HOSPITAL_BASED_OUTPATIENT_CLINIC_OR_DEPARTMENT_OTHER)
Admission: RE | Admit: 2018-02-11 | Discharge: 2018-02-11 | Disposition: A | Discharge status: Home / Self Care | Payer: BLUE CROSS/BLUE SHIELD | Source: Ambulatory Visit | Attending: Obstetrics & Gynecology | Admitting: Obstetrics & Gynecology

## 2018-02-11 ENCOUNTER — Other Ambulatory Visit: Payer: Self-pay | Admitting: Obstetrics & Gynecology

## 2018-02-11 ENCOUNTER — Ambulatory Visit (HOSPITAL_BASED_OUTPATIENT_CLINIC_OR_DEPARTMENT_OTHER)
Admission: RE | Admit: 2018-02-11 | Discharge: 2018-02-11 | Disposition: A | Discharge status: Home / Self Care | Payer: BLUE CROSS/BLUE SHIELD | Source: Ambulatory Visit | Admitting: Maternal and Fetal Medicine

## 2018-02-11 DIAGNOSIS — Z3A36 36 weeks gestation of pregnancy: Secondary | ICD-10-CM | POA: Insufficient documentation

## 2018-02-11 DIAGNOSIS — O365930 Maternal care for other known or suspected poor fetal growth, third trimester, not applicable or unspecified: Secondary | ICD-10-CM

## 2018-02-11 DIAGNOSIS — O365910 Maternal care for other known or suspected poor fetal growth, first trimester, not applicable or unspecified: Secondary | ICD-10-CM

## 2018-02-11 DIAGNOSIS — O43893 Other placental disorders, third trimester: Secondary | ICD-10-CM | POA: Insufficient documentation

## 2018-02-11 DIAGNOSIS — O283 Abnormal ultrasonic finding on antenatal screening of mother: Secondary | ICD-10-CM | POA: Insufficient documentation

## 2018-02-11 HISTORY — DX: Anxiety disorder, unspecified: F41.9

## 2018-02-11 HISTORY — DX: Depression, unspecified: F32.A

## 2018-02-11 HISTORY — DX: Maternal care for other known or suspected poor fetal growth, third trimester, not applicable or unspecified: O36.5930

## 2018-02-11 NOTE — Progress Notes (Signed)
PROGRESS NOTE    Date Time: 02/11/18 1:00 PM  Patient Name: ZOXWRU,EAVWUJWJ  Requesting Physician: Donald Siva, MD  [address]  [place], [place] [address]  Consulting Physician: Camelia Phenes, MD    Reason for Note: [facility]    Dear Dr. Henrene Hawking, Lavada Mesi, MD  [address]  [place], [place] [address]:     Following my ultrasound examination on your patient Ms. Albany,Boneta, she had several questions regarding issues with potential effect on the pregnancy. This letter summarizes my answers to her. As you know she is 32 y.o. year old G3P2002 now at [redacted]w[redacted]d weeks gestation (Estimated Date of Delivery: 03/07/18).  Pregnancy has been remarkable for fetal growth restriction (FGR) and late transfer of care.  Obstetric history is remarkable for 2 NSVD of AGA infants (8lb 4 oz and 9 lb 3 oz).     I spent 15 minutes of this 15-minute visit on face to face discussing the following:    I have explained that the abnormal Doppler indices in her case suggest the fetal smallness is on a placenta dysfunctional basis. Such diagnosis implies an increase in risk of stillbirth particularly at term. The reason why such complication is observed in the current but not in the previous pregnancies is most likely because it was conceived with a different husband.  Obviously the threshold for intervention and delivery is much lower at or near term, given the overall acceptable neonatal outcome and significant (and mounting) residual risk of stillbirth. Randomized clinical trials have not been able to identify a perfect gestational age threshold to optimize outcome. Experts opinion supports delivery at 38-39 weeks in the presence of isolated FGR; at 37.0 weeks if FGR is associated with elevated umbilical artery Doppler (such as her case).  As for the mode of delivery, the risk of cesarean section is obviously increased, independently from whether labor is spontaneous or induced. However if fetal testing remains  reassuring, there is no need for elective cesarean section.    Neonates born at term with birth weight <10th centile have increased risk (16%) of requiring NICU admission for severe morbidity compared with AGA controls (10%); those with birth weight <5th centile have a 21% risk.    In a future pregnancy there is a recurrence risk which can be lowered by intake of low dose aspirin.    Plan:  - induction of labor at 37 weeks    Thank you again for allowing me to participate in the care of this patient. If you have questions, please do not hesitate to call me.     Sincerely,    Signed by: Camelia Phenes, MD

## 2018-02-13 ENCOUNTER — Inpatient Hospital Stay
Admission: RE | Admit: 2018-02-13 | Discharge: 2018-02-16 | DRG: 807 | Disposition: A | Discharge status: Home / Self Care | Payer: BLUE CROSS/BLUE SHIELD | Source: Ambulatory Visit | Attending: Obstetrics & Gynecology | Admitting: Obstetrics & Gynecology

## 2018-02-13 ENCOUNTER — Observation Stay: Payer: BLUE CROSS/BLUE SHIELD

## 2018-02-13 DIAGNOSIS — O365930 Maternal care for other known or suspected poor fetal growth, third trimester, not applicable or unspecified: Principal | ICD-10-CM | POA: Diagnosis present

## 2018-02-13 DIAGNOSIS — Z3A36 36 weeks gestation of pregnancy: Secondary | ICD-10-CM

## 2018-02-13 DIAGNOSIS — Z3A37 37 weeks gestation of pregnancy: Secondary | ICD-10-CM

## 2018-02-13 HISTORY — DX: Maternal care for other known or suspected poor fetal growth, unspecified trimester, not applicable or unspecified: O36.5990

## 2018-02-13 HISTORY — DX: Obesity, unspecified: E66.9

## 2018-02-13 LAB — CBC AND DIFFERENTIAL
Absolute NRBC: 0 10*3/uL (ref 0.00–0.00)
Basophils Absolute Automated: 0.05 10*3/uL (ref 0.00–0.08)
Basophils Automated: 0.3 %
Eosinophils Absolute Automated: 0.04 10*3/uL (ref 0.00–0.44)
Eosinophils Automated: 0.2 %
Hematocrit: 39 % (ref 34.7–43.7)
Hgb: 12.6 g/dL (ref 11.4–14.8)
Immature Granulocytes Absolute: 0.12 10*3/uL — ABNORMAL HIGH (ref 0.00–0.07)
Immature Granulocytes: 0.7 %
Lymphocytes Absolute Automated: 3.44 10*3/uL — ABNORMAL HIGH (ref 0.42–3.22)
Lymphocytes Automated: 21.3 %
MCH: 28.3 pg (ref 25.1–33.5)
MCHC: 32.3 g/dL (ref 31.5–35.8)
MCV: 87.6 fL (ref 78.0–96.0)
MPV: 10.4 fL (ref 8.9–12.5)
Monocytes Absolute Automated: 0.75 10*3/uL (ref 0.21–0.85)
Monocytes: 4.6 %
Neutrophils Absolute: 11.74 10*3/uL — ABNORMAL HIGH (ref 1.10–6.33)
Neutrophils: 72.9 %
Nucleated RBC: 0 /100 WBC (ref 0.0–0.0)
Platelets: 247 10*3/uL (ref 142–346)
RBC: 4.45 10*6/uL (ref 3.90–5.10)
RDW: 14 % (ref 11–15)
WBC: 16.14 10*3/uL — ABNORMAL HIGH (ref 3.10–9.50)

## 2018-02-13 LAB — PREGNANCY INDUCED HYPERTENSION PANEL
ALT: 11 U/L (ref 0–55)
AST (SGOT): 16 U/L (ref 5–34)
Creatinine: 0.7 mg/dL (ref 0.6–1.0)
LDH: 241 U/L (ref 125–331)
Uric acid: 4 mg/dL (ref 2.6–6.0)

## 2018-02-13 LAB — GFR: EGFR: >60

## 2018-02-13 LAB — HEMOLYSIS INDEX: Hemolysis Index: 5 (ref 0–18)

## 2018-02-13 LAB — PROTEIN / CREATININE RATIO, URINE
Urine Creatinine, Random: 138.4 mg/dL
Urine Protein Random: 12.5 mg/dL (ref 1.0–14.0)
Urine Protein/Creatinine Ratio: 0.1

## 2018-02-13 LAB — TYPE AND SCREEN
AB Screen Gel: NEGATIVE
ABO Rh: B POS

## 2018-02-13 MED ORDER — ONDANSETRON 4 MG PO TBDP
4.0000 mg | ORAL_TABLET | Freq: Three times a day (TID) | ORAL | Status: DC | PRN
Start: 2018-02-13 — End: 2018-02-14

## 2018-02-13 MED ORDER — ONDANSETRON HCL 4 MG/2ML IJ SOLN
4.0000 mg | Freq: Three times a day (TID) | INTRAMUSCULAR | Status: DC | PRN
Start: 2018-02-13 — End: 2018-02-14

## 2018-02-13 MED ORDER — ZOLPIDEM TARTRATE 5 MG PO TABS
10.0000 mg | ORAL_TABLET | Freq: Every evening | ORAL | Status: DC | PRN
Start: 2018-02-13 — End: 2018-02-14

## 2018-02-13 MED ORDER — LIDOCAINE HCL (PF) 1 % IJ SOLN
5.0000 mL | INTRAMUSCULAR | Status: DC
Start: 2018-02-13 — End: 2018-02-14

## 2018-02-13 MED ORDER — PROMETHAZINE HCL 12.5 MG RE SUPP
12.5000 mg | Freq: Four times a day (QID) | RECTAL | Status: DC | PRN
Start: 2018-02-13 — End: 2018-02-14

## 2018-02-13 MED ORDER — PROMETHAZINE HCL 12.5 MG PO TABS
12.5000 mg | ORAL_TABLET | Freq: Four times a day (QID) | ORAL | Status: DC | PRN
Start: 2018-02-13 — End: 2018-02-14

## 2018-02-13 MED ORDER — NALOXONE HCL 0.4 MG/ML IJ SOLN (WRAP)
0.2000 mg | INTRAMUSCULAR | Status: DC | PRN
Start: 2018-02-13 — End: 2018-02-14

## 2018-02-13 MED ORDER — TERBUTALINE SULFATE 1 MG/ML IJ SOLN
0.2500 mg | Freq: Once | INTRAMUSCULAR | Status: DC | PRN
Start: 2018-02-13 — End: 2018-02-14

## 2018-02-13 MED ORDER — SERTRALINE HCL 50 MG PO TABS
50.0000 mg | ORAL_TABLET | Freq: Every day | ORAL | Status: DC
Start: 2018-02-13 — End: 2018-02-14
  Filled 2018-02-13 (×2): qty 1

## 2018-02-13 MED ORDER — DINOPROSTONE 10 MG VA INST
10.00 mg | VAGINAL_INSERT | Freq: Once | VAGINAL | Status: AC
Start: 2018-02-13 — End: 2018-02-13
  Administered 2018-02-13: 21:00:00 10 mg via VAGINAL
  Filled 2018-02-13: qty 1

## 2018-02-13 MED ORDER — LACTATED RINGERS IV SOLN
INTRAVENOUS | Status: DC
Start: 2018-02-13 — End: 2018-02-14

## 2018-02-13 MED ORDER — ACETAMINOPHEN 650 MG RE SUPP
650.0000 mg | RECTAL | Status: DC | PRN
Start: 2018-02-13 — End: 2018-02-14

## 2018-02-13 MED ORDER — CLINDAMYCIN PHOSPHATE IN D5W 900 MG/50ML IV SOLN
900.00 mg | Freq: Three times a day (TID) | INTRAVENOUS | Status: DC
Start: 2018-02-13 — End: 2018-02-14
  Administered 2018-02-13 – 2018-02-14 (×3): 900 mg via INTRAVENOUS
  Filled 2018-02-13 (×3): qty 50

## 2018-02-13 MED ORDER — PROMETHAZINE HCL 25 MG/ML IJ SOLN
6.2500 mg | Freq: Four times a day (QID) | INTRAMUSCULAR | Status: DC | PRN
Start: 2018-02-13 — End: 2018-02-14

## 2018-02-13 MED ORDER — ACETAMINOPHEN 325 MG PO TABS
650.0000 mg | ORAL_TABLET | ORAL | Status: DC | PRN
Start: 2018-02-13 — End: 2018-02-14

## 2018-02-13 NOTE — Plan of Care (Signed)
Problem: Safety  Goal: Patient will be free from injury during hospitalization  Outcome: Progressing  Goal: Patient will be free from infection during hospitalization  Outcome: Progressing     Problem: Pain  Goal: Pain at adequate level as identified by patient  Outcome: Progressing     Problem: Side Effects from Pain Analgesia  Goal: Patient will experience minimal side effects of analgesic therapy  Outcome: Progressing     Problem: Discharge Barriers  Goal: Patient will be discharged home or other facility with appropriate resources  Outcome: Progressing     Problem: Psychosocial and Spiritual Needs  Goal: Demonstrates ability to cope with hospitalization/illness  Outcome: Progressing     Problem: Vaginal/Cesarean Delivery  Goal: Maternal Status within defined parameters  Outcome: Progressing  Goal: Evidence of Fetal Well Being  Outcome: Progressing  Goal: Free from Maternal/Fetal Infection  Outcome: Progressing  Goal: Intrapartum management of pain/discomfort  Outcome: Progressing  Goal: VTE Prevention  Outcome: Progressing

## 2018-02-13 NOTE — H&P (Signed)
History and Physical    12/6/20197:10 PM    Chief Complaint: I am herefor my induction.    HPI:  Melinda Gonzalez is a 32 y.o. [id number] [redacted]w[redacted]d weeks gestation with Estimated Date of Delivery: 03/07/18 who presents for Ripening & Induction due to FGR & abnormal dopplers.    Prenatal complications:                                                         POBHx:   OB History   Gravida Para Term Preterm AB Living   3 2 2     2    SAB TAB Ectopic Multiple Live Births           2      # Outcome Date GA Lbr Len/2nd Weight Sex Delivery Anes PTL Lv   3 Current            2 Term 06/09/12     Vag-Spont   LIV   1 Term 09/25/07     Vag-Spont   LIV       PMHx:   Past Medical History:   Diagnosis Date   . Anxiety    . Depression    . IUGR (intrauterine growth restriction) affecting care of mother    . IUGR (intrauterine growth restriction) affecting care of mother, third trimester, not applicable or unspecified fetus 02/11/2018       Surg Hx:   Past Surgical History:   Procedure Laterality Date   . WISDOM TOOTH EXTRACTION         Fam Hx:   Family History   Problem Relation Age of Onset   . No known problems Mother    . No known problems Father    . Bone cancer Paternal Uncle    . Liver cancer Paternal Uncle    . Hypertension Maternal Grandmother        Soc Hx:   Social History     Socioeconomic History   . Marital status: Single     Spouse name: Not on file   . Number of children: Not on file   . Years of education: Not on file   . Highest education level: Not on file   Occupational History   . Not on file   Social Needs   . Financial resource strain: Not on file   . Food insecurity:     Worry: Not on file     Inability: Not on file   . Transportation needs:     Medical: Not on file     Non-medical: Not on file   Tobacco Use   . Smoking status: Never Smoker   . Smokeless tobacco: Never Used   Substance and Sexual Activity   . Alcohol use: Not Currently   . Drug use: No   . Sexual activity: Not Currently   Lifestyle   . Physical  activity:     Days per week: Not on file     Minutes per session: Not on file   . Stress: Not on file   Relationships   . Social connections:     Talks on phone: Not on file     Gets together: Not on file     Attends religious service: Not on file     Active member of  club or organization: Not on file     Attends meetings of clubs or organizations: Not on file     Relationship status: Not on file   . Intimate partner violence:     Fear of current or ex partner: Not on file     Emotionally abused: Not on file     Physically abused: Not on file     Forced sexual activity: Not on file   Other Topics Concern   . Not on file   Social History Narrative   . Not on file       Meds:   Medications Prior to Admission   Medication Sig Dispense Refill Last Dose   . Prenatal MV-Min-Fe Fum-FA-DHA (PRENATAL 1 PO) Take by mouth   02/12/2018 at Unknown time   . sertraline (ZOLOFT) 50 MG tablet Take 50 mg by mouth daily   02/13/2018 at Unknown time   . levonorgestrel (MIRENA) 20 MCG/24HR IUD 1 each by Intrauterine route once. Placed May 2014   Unknown at Unknown time       Allergies:    Allergies   Allergen Reactions   . Penicillins Swelling       ROS: All negative    BP (!) 146/91   Pulse 83   Temp 98.1 F (36.7 C) (Oral)     Gen: NAD, AAO x 3  HEENT: PERRLA  Lungs: CTA bilaterally  Heart: RRR  Abd: Soft, gravid, nontender    FHR: Baseline Rate: 150 BPM, Variability: Moderate, Pattern: Accelerations, FHR Category: Category I  Ctx: Contraction Frequency: occasional     Cervix: 1 cms    Labs:  Recent Labs   Lab 02/13/18  1843   WBC 16.14*   Hgb 12.6   Hematocrit 39.0   Platelets 247              Assessment:    [redacted]w[redacted]d weeks IUP  FGR & Abnormal dopplers     Plan:  Admit to L&D  Ripening with Cervidil & Induction     Georgianne Fick, MD

## 2018-02-14 ENCOUNTER — Encounter: Payer: Self-pay | Admitting: Anesthesiology

## 2018-02-14 MED ORDER — BISACODYL 10 MG RE SUPP
10.0000 mg | Freq: Every day | RECTAL | Status: DC | PRN
Start: 2018-02-14 — End: 2018-02-16

## 2018-02-14 MED ORDER — MAGNESIUM HYDROXIDE 400 MG/5ML PO SUSP
30.0000 mL | Freq: Four times a day (QID) | ORAL | Status: DC | PRN
Start: 2018-02-14 — End: 2018-02-16

## 2018-02-14 MED ORDER — SERTRALINE HCL 50 MG PO TABS
50.0000 mg | ORAL_TABLET | Freq: Every day | ORAL | Status: DC
Start: 2018-02-14 — End: 2018-02-14

## 2018-02-14 MED ORDER — SOD CITRATE-CITRIC ACID 500-334 MG/5ML PO SOLN
30.0000 mL | Freq: Once | ORAL | Status: DC | PRN
Start: 2018-02-14 — End: 2018-02-14

## 2018-02-14 MED ORDER — FAMOTIDINE 10 MG/ML IV SOLN (WRAP)
20.0000 mg | Freq: Once | INTRAVENOUS | Status: DC | PRN
Start: 2018-02-14 — End: 2018-02-14

## 2018-02-14 MED ORDER — PROMETHAZINE HCL 12.5 MG RE SUPP
12.5000 mg | Freq: Four times a day (QID) | RECTAL | Status: DC | PRN
Start: 2018-02-14 — End: 2018-02-16
  Filled 2018-02-14 (×4): qty 1

## 2018-02-14 MED ORDER — METOCLOPRAMIDE HCL 5 MG/ML IJ SOLN
10.0000 mg | Freq: Once | INTRAMUSCULAR | Status: DC | PRN
Start: 2018-02-14 — End: 2018-02-14

## 2018-02-14 MED ORDER — EPHEDRINE SULFATE 50 MG/ML IJ/IV SOLN (WRAP)
Status: AC
Start: 2018-02-14 — End: 2018-02-14
  Administered 2018-02-14: 14:00:00 50 mg
  Filled 2018-02-14: qty 1

## 2018-02-14 MED ORDER — ACETAMINOPHEN 650 MG RE SUPP
650.0000 mg | RECTAL | Status: DC | PRN
Start: 2018-02-14 — End: 2018-02-16

## 2018-02-14 MED ORDER — PROMETHAZINE HCL 12.5 MG PO TABS
12.5000 mg | ORAL_TABLET | Freq: Four times a day (QID) | ORAL | Status: DC | PRN
Start: 2018-02-14 — End: 2018-02-14

## 2018-02-14 MED ORDER — NALOXONE HCL 0.4 MG/ML IJ SOLN (WRAP)
0.2000 mg | INTRAMUSCULAR | Status: DC | PRN
Start: 2018-02-14 — End: 2018-02-14

## 2018-02-14 MED ORDER — OXYTOCIN-SODIUM CHLORIDE 30-0.9 UT/500ML-% IV SOLN
2.0000 m[IU]/min | INTRAVENOUS | Status: DC
Start: 2018-02-14 — End: 2018-02-16

## 2018-02-14 MED ORDER — EPHEDRINE SULFATE 50 MG/ML IJ/IV SOLN (WRAP)
10.0000 mg | Freq: Once | Status: DC | PRN
Start: 2018-02-14 — End: 2018-02-14

## 2018-02-14 MED ORDER — BENZOCAINE 20% +/- MENTHOL 0.5% EX AERO (WRAP)
1.0000 | INHALATION_SPRAY | CUTANEOUS | Status: DC | PRN
Start: 2018-02-14 — End: 2018-02-16
  Administered 2018-02-14: 18:00:00 1 via TOPICAL
  Filled 2018-02-14: qty 78

## 2018-02-14 MED ORDER — HYDROCORTISONE 1 % EX OINT
TOPICAL_OINTMENT | Freq: Three times a day (TID) | CUTANEOUS | Status: DC | PRN
Start: 2018-02-14 — End: 2018-02-16
  Filled 2018-02-14: qty 28.35

## 2018-02-14 MED ORDER — ONDANSETRON 4 MG PO TBDP
4.0000 mg | ORAL_TABLET | Freq: Three times a day (TID) | ORAL | Status: DC | PRN
Start: 2018-02-14 — End: 2018-02-16

## 2018-02-14 MED ORDER — ONDANSETRON HCL 4 MG/2ML IJ SOLN
4.0000 mg | Freq: Three times a day (TID) | INTRAMUSCULAR | Status: DC | PRN
Start: 2018-02-14 — End: 2018-02-16

## 2018-02-14 MED ORDER — FENTANYL-BUPIVACAINE-NACL 0.2-0.125-0.9 MG/100ML-% EP SOLN
EPIDURAL | Status: AC
Start: 2018-02-14 — End: 2018-02-14
  Filled 2018-02-14: qty 100

## 2018-02-14 MED ORDER — BUPIVACAINE HCL (PF) 0.25 % IJ SOLN
INTRAMUSCULAR | Status: DC | PRN
Start: 2018-02-14 — End: 2018-02-14
  Administered 2018-02-14 (×2): 5 mL via EPIDURAL

## 2018-02-14 MED ORDER — AMMONIA AROMATIC IN INHA
1.00 | Freq: Once | RESPIRATORY_TRACT | Status: DC | PRN
Start: 2018-02-14 — End: 2018-02-16

## 2018-02-14 MED ORDER — WITCH HAZEL-GLYCERIN EX PADS
1.0000 | MEDICATED_PAD | CUTANEOUS | Status: DC | PRN
Start: 2018-02-14 — End: 2018-02-16
  Administered 2018-02-14 – 2018-02-16 (×2): 1 via TOPICAL
  Filled 2018-02-14 (×2): qty 40

## 2018-02-14 MED ORDER — NALOXONE HCL 0.4 MG/ML IJ SOLN (WRAP)
0.1000 mg | INTRAMUSCULAR | Status: DC | PRN
Start: 2018-02-14 — End: 2018-02-14

## 2018-02-14 MED ORDER — IBUPROFEN 600 MG PO TABS
600.0000 mg | ORAL_TABLET | Freq: Four times a day (QID) | ORAL | Status: DC
Start: 2018-02-14 — End: 2018-02-16
  Administered 2018-02-14 – 2018-02-16 (×7): 600 mg via ORAL
  Filled 2018-02-14 (×8): qty 1

## 2018-02-14 MED ORDER — TETANUS-DIPHTH-ACELL PERTUSSIS 5-2.5-18.5 LF-MCG/0.5 IM SUSP
0.5000 mL | INTRAMUSCULAR | Status: DC | PRN
Start: 2018-02-14 — End: 2018-02-16

## 2018-02-14 MED ORDER — PROMETHAZINE HCL 12.5 MG PO TABS
12.5000 mg | ORAL_TABLET | Freq: Four times a day (QID) | ORAL | Status: DC | PRN
Start: 2018-02-14 — End: 2018-02-16

## 2018-02-14 MED ORDER — ONDANSETRON HCL 4 MG/2ML IJ SOLN
4.0000 mg | Freq: Three times a day (TID) | INTRAMUSCULAR | Status: DC | PRN
Start: 2018-02-14 — End: 2018-02-14

## 2018-02-14 MED ORDER — SENNOSIDES-DOCUSATE SODIUM 8.6-50 MG PO TABS
1.0000 | ORAL_TABLET | Freq: Every evening | ORAL | Status: DC | PRN
Start: 2018-02-14 — End: 2018-02-16
  Administered 2018-02-16: 1 via ORAL
  Filled 2018-02-14: qty 1

## 2018-02-14 MED ORDER — MISOPROSTOL 200 MCG PO TABS
800.0000 ug | ORAL_TABLET | Freq: Once | ORAL | Status: DC | PRN
Start: 2018-02-14 — End: 2018-02-16

## 2018-02-14 MED ORDER — OXYCODONE-ACETAMINOPHEN 5-325 MG PO TABS
1.0000 | ORAL_TABLET | Freq: Once | ORAL | Status: DC | PRN
Start: 2018-02-14 — End: 2018-02-14

## 2018-02-14 MED ORDER — OXYTOCIN-SODIUM CHLORIDE 30-0.9 UT/500ML-% IV SOLN
7.5000 [IU]/h | INTRAVENOUS | Status: DC
Start: 2018-02-14 — End: 2018-02-16

## 2018-02-14 MED ORDER — PROMETHAZINE HCL 25 MG/ML IJ SOLN
6.2500 mg | Freq: Four times a day (QID) | INTRAMUSCULAR | Status: DC | PRN
Start: 2018-02-14 — End: 2018-02-14

## 2018-02-14 MED ORDER — ONDANSETRON 4 MG PO TBDP
4.0000 mg | ORAL_TABLET | Freq: Three times a day (TID) | ORAL | Status: DC | PRN
Start: 2018-02-14 — End: 2018-02-14

## 2018-02-14 MED ORDER — PRENATAL PLUS IRON 29-1 MG PO TABS
1.0000 | ORAL_TABLET | Freq: Every day | ORAL | Status: DC
Start: 2018-02-14 — End: 2018-02-16
  Administered 2018-02-14 – 2018-02-15 (×2): 1 via ORAL
  Filled 2018-02-14 (×2): qty 1

## 2018-02-14 MED ORDER — OXYCODONE-ACETAMINOPHEN 5-325 MG PO TABS
1.0000 | ORAL_TABLET | ORAL | Status: DC | PRN
Start: 2018-02-14 — End: 2018-02-16

## 2018-02-14 MED ORDER — LIDOCAINE-EPINEPHRINE 1.5 %-1:200000 IJ SOLN
INTRAMUSCULAR | Status: DC | PRN
Start: 2018-02-14 — End: 2018-02-14
  Administered 2018-02-14: 3 mL via EPIDURAL

## 2018-02-14 MED ORDER — PROMETHAZINE HCL 25 MG/ML IJ SOLN
6.2500 mg | Freq: Four times a day (QID) | INTRAMUSCULAR | Status: DC | PRN
Start: 2018-02-14 — End: 2018-02-16

## 2018-02-14 MED ORDER — MEASLES, MUMPS & RUBELLA VAC IJ SOLR
0.5000 mL | INTRAMUSCULAR | Status: DC | PRN
Start: 2018-02-14 — End: 2018-02-16

## 2018-02-14 MED ORDER — ACETAMINOPHEN 325 MG PO TABS
650.0000 mg | ORAL_TABLET | ORAL | Status: DC | PRN
Start: 2018-02-14 — End: 2018-02-16
  Administered 2018-02-15 (×2): 650 mg via ORAL
  Filled 2018-02-14 (×2): qty 2

## 2018-02-14 MED ORDER — METHYLERGONOVINE MALEATE 0.2 MG/ML IJ SOLN
200.0000 ug | INTRAMUSCULAR | Status: DC | PRN
Start: 2018-02-14 — End: 2018-02-16

## 2018-02-14 MED ORDER — SERTRALINE HCL 50 MG PO TABS
50.0000 mg | ORAL_TABLET | Freq: Every day | ORAL | Status: DC
Start: 2018-02-14 — End: 2018-02-16
  Administered 2018-02-14 – 2018-02-16 (×2): 50 mg via ORAL
  Filled 2018-02-14 (×3): qty 1

## 2018-02-14 MED ORDER — LANOLIN EX OINT
TOPICAL_OINTMENT | CUTANEOUS | Status: DC | PRN
Start: 2018-02-14 — End: 2018-02-16
  Filled 2018-02-14: qty 7

## 2018-02-14 MED ORDER — OXYTOCIN-SODIUM CHLORIDE 30-0.9 UT/500ML-% IV SOLN
INTRAVENOUS | Status: AC
Start: 2018-02-14 — End: 2018-02-14
  Administered 2018-02-14: 11:00:00 2 m[IU]/min via INTRAVENOUS
  Filled 2018-02-14: qty 500

## 2018-02-14 MED ORDER — LACTATED RINGERS IV BOLUS
1000.0000 mL | Freq: Once | INTRAVENOUS | Status: DC
Start: 2018-02-14 — End: 2018-02-14

## 2018-02-14 MED ORDER — FENTANYL-BUPIVACAINE-NACL 0.2-0.125-0.9 MG/100ML-% EP SOLN
12.0000 mL/h | EPIDURAL | Status: DC
Start: 2018-02-14 — End: 2018-02-14
  Administered 2018-02-14: 13:00:00 12 mL/h via EPIDURAL

## 2018-02-14 MED ORDER — IBUPROFEN 600 MG PO TABS
600.0000 mg | ORAL_TABLET | Freq: Once | ORAL | Status: DC | PRN
Start: 2018-02-14 — End: 2018-02-14

## 2018-02-14 MED ORDER — NALOXONE HCL 0.4 MG/ML IJ SOLN (WRAP)
0.2000 mg | INTRAMUSCULAR | Status: DC | PRN
Start: 2018-02-14 — End: 2018-02-16

## 2018-02-14 MED ORDER — PROMETHAZINE HCL 12.5 MG RE SUPP
12.5000 mg | Freq: Four times a day (QID) | RECTAL | Status: DC | PRN
Start: 2018-02-14 — End: 2018-02-14

## 2018-02-14 NOTE — Plan of Care (Signed)
Admitted patient to room 2025. Oriented patient to room and surroundings. Started Patient pumping, infant in nicu. Went over Nicu visiting hours. POC discussed. All questions asked and answered.

## 2018-02-14 NOTE — Progress Notes (Signed)
12/7/201910:28 AM    Patient: Melinda Gonzalez,Melinda Gonzalez    Blood pressure 139/86, pulse 76, temperature 98.2 F (36.8 C), temperature source Oral, resp. rate 14, height 1.727 m (5\' 8" ), weight 108.4 kg (239 lb).    FHR: Baseline Rate: 145 BPM, Variability: Moderate, Pattern: Other (Comment)(one variable decel), FHR Category: Category I    Ctx: Contraction Frequency: 7-9    Cervix: Dilation: 2 Effacement (%): 60 Station: -2        Assessment:   [redacted]w[redacted]d weeks IUP, we will start Pitocin if repetitive variable or late we will due a CD.      Plan:    Pitocin Induction  NPO    Georgianne Fick, MD

## 2018-02-14 NOTE — Plan of Care (Signed)
Pt stable at this time, no complaints of headaches, visual disturbances or dizziness. Encouraged to increase oral fluid intake, hand expression and breastfeeding; instructed to call if needing assistance. Assisted pt to bathroom for the second time. Pt demonstrated understanding of use of peri aids. Pain is well controlled and pt is in good spirits. Pt is currently in NICU in wheelchair with FOB visiting with baby. Will continue to monitor.

## 2018-02-14 NOTE — Anesthesia Procedure Notes (Signed)
Epidural    Patient location during procedure: L&D  Reason for block: Labor or C-section  Block at Surgeon's request: Yes    Start time: 02/14/2018 12:59 PM  End time: 02/14/2018 1:17 PM    Staffing  Anesthesiologist: Dickey Gave, MD  Performed: anesthesiologist     Pre-procedure Checklist   Completed: patient identified, site marked, surgical consent, pre-op evaluation, timeout performed, risks and benefits discussed, monitors and equipment checked, anesthesia consent given and correct site      Epidural  Patient monitoring: Pulse oximetry, EKG and NIBP    Premedication: No  Patient position: sitting    Skin Local: lidocaine 1%  Dose: 3 mL    Attempts  Number of attempts: 2    Attempt 1  Interspace: L4-5  Approach: midline                Successful attempt  Interspace: L3-4  Approach: midline    Needle type: Touhy needle   Needle gauge: 17  Injection technique: LOR air  Epidural Space ID: 7 cm  CSF Return: No   Blood Return: No  Paresthesia Pain: No    Needle Placement  Needle type: Touhy needle   Needle gauge: 17  Injection technique: LOR air  CSF Return: No  Blood Return: No          Paresthesia Pain: No    Catheter Placement   Catheter type: end hole  Catheter size: 19 G  Catheter at skin depth: 11 cm  CSF Return: No  Blood Return: No  Test Dose:1.5 % lidocaine and negative  3 cc        Assessment   Patient tolerated procedure well: Yes  Block Outcome: no complications, successful block and pain improved

## 2018-02-14 NOTE — Progress Notes (Signed)
Report given to Emmett, RN. Care relinquished.

## 2018-02-14 NOTE — Anesthesia Preprocedure Evaluation (Signed)
Anesthesia Evaluation    AIRWAY    Mallampati: III    TM distance: >3 FB  Neck ROM: full  Mouth Opening:full  Planned to use difficult airway equipment: No CARDIOVASCULAR    regular and normal       DENTAL           PULMONARY    clear to auscultation     OTHER FINDINGS                  Relevant Problems   No relevant active problems               Anesthesia Plan    ASA 2     epidural                     Detailed anesthesia plan: epidural      Post Op: plan for postoperative opioid use    Post op pain management: per surgeon and PO analgesics    informed consent obtained      pertinent labs reviewed             Signed by: Dickey Gave 02/14/18 1:34 PM

## 2018-02-14 NOTE — Plan of Care (Signed)
Problem: Vaginal/Cesarean Delivery  Goal: Maternal Status within defined parameters  Outcome: Progressing  Goal: Evidence of Fetal Well Being  Outcome: Progressing  Goal: Free from Maternal/Fetal Infection  Outcome: Progressing  Goal: Intrapartum management of pain/discomfort  Outcome: Progressing  Goal: VTE Prevention  Outcome: Progressing

## 2018-02-15 NOTE — Consults (Signed)
Visited patient at bedside for initial lactation consultation. FOB present during visit. P3, s/p vaginal  delivery of 37 week baby girl, "Jasmine".Baby in NICU. Patient breastfed older child, now 10 years, for 4 months; second child placed for adoption;  intends to exclusively breastfeed this baby for longer. Patient denies history of smoking, breast surgery, PCOS. Takes Zoloft for depression. Pt has started double pumping, using 25 mm flanges, suction setting at 20% for 20 min; denies nipple discomfort with pumping; unable to collect milk with pumping. Discussed/demonstrated hand expression with positive return demonstration and colostrum expressed bilaterally. Encouraged to massage breasts prior to pumping and hand express afterward.      Whiteboard updated with LC contact information. Encouraged mother to call RN and/or LC for ongoing lactation support, PRN.     Plan to follow-up for pumping progress.

## 2018-02-15 NOTE — Plan of Care (Signed)
Pt with adequate pain management while taking motrin 600 mg every 6 hrs and tylenol as needed. Pt breast pump regularly for her baby in NICU, ambulates in bathroom independently well. Will continue to monitor pt's condition, visitors at bedside.

## 2018-02-15 NOTE — Addendum Note (Signed)
Addendum  created 02/15/18 0854 by Dickey Gave, MD    Clinical Note Signed

## 2018-02-15 NOTE — Progress Notes (Signed)
Postpartum Note    Subjective:  Delivery Type: Vaginal   No complaints.  Minimal Bleeding.  Breastfeeding well.    Objective:  Vital Signs:  Temp:  [97 F (36.1 C)-98.5 F (36.9 C)] 97.1 F (36.2 C)  Heart Rate:  [60-91] 80  Resp Rate:  [18] 18  BP: (81-165)/(48-94) 134/86  Breast: soft, non tender   Fundus: firm, non tender  Lochia: mod, rubra.    Recent Labs:  Recent Labs   Lab 02/13/18  1843   WBC 16.14*   Hgb 12.6   Hematocrit 39.0   Platelets 247       Assessment/Plan:  Stable - Postpartum Day 1  Routine care, discharge  tomorrow, F/U in 6 weeks.  Prescriptions given: PNV, Motrin.    Anda Kraft Whyatt Klinger

## 2018-02-15 NOTE — Anesthesia Postprocedure Evaluation (Signed)
Anesthesia Post Evaluation    Patient: Melinda Gonzalez    * No procedures listed *    Anesthesia type: epidural             Anesthesia Post Evaluation:     Patient Evaluated: floor  Patient Participation: complete - patient participated  Level of Consciousness: awake and alert    Pain Management: adequate    Airway Patency: patent    Anesthetic complications: No      PONV Status: none    Cardiovascular status: acceptable  Respiratory status: acceptable  Hydration status: acceptable        Signed by: Dickey Gave, 02/15/2018 8:53 AM

## 2018-02-15 NOTE — Plan of Care (Signed)
Problem: Vaginal/Cesarean Delivery  Goal: Postpartum management of pain/discomfort  Outcome: Progressing  Flowsheets (Taken 02/15/2018 2023)  Postpartum management of pain/discomfort: Assess pain using a consistent, developmental/age appropriate pain scale; Assess pain level before and following intervention  Goal: Uterine management  Outcome: Progressing  Flowsheets (Taken 02/15/2018 2023)  Uterine management: Assess fundus and notify LIP if not firm, midline, or at or below the umbilicus, or if abdomen is abnormally distended; Assess for hemorrhage risk using appropriate screening tool  Goal: Perineum will be clean, dry, and intact and without discharge or hematoma  Outcome: Progressing  Flowsheets (Taken 02/15/2018 2023)  Perineum will be clean, dry, and intact and without discharge or hematoma: Place cold pack on perineum; Provide pericare; Sitz bath PRN as ordered by LIP; Monitor wound/incision for signs of infections; Assess for hemorrhoids and provide interventions

## 2018-02-16 MED ORDER — IBUPROFEN 600 MG PO TABS
600.00 mg | ORAL_TABLET | Freq: Four times a day (QID) | ORAL | 1 refills | Status: AC
Start: 2018-02-16 — End: ?

## 2018-02-16 NOTE — Lactation Note (Signed)
This note was copied from a baby's chart.  Visited family at baby's bedside in the NICU to offer lactation support.  Reviewed the benefits of holding baby skin to skin.  Demonstrated hand expression and encouraged mother to practice this skill.  Recommended she get a drop of milk on her nipple when attempting to latch the baby.  Showed mother how to hold and latch baby in the c/c position.  Baby sucked a few times off and on, but was very sleepy at this time.  Reviewed the importance of pumping at least 8 times every 24 hours.  Mother plans to rent a hospital grade breast pump for use after discharge.  Current questions addressed. The plan is for follow up help with lactation staff tomorrow at 11:00 in the NICU.  Darol Destine RNC-MNN, IBCLC

## 2018-02-16 NOTE — Plan of Care (Signed)
Call the office to schedule your follow up appointment.  Information on how to take care of yourself after baby can be found in the New Mother information care booklet.  Please refer to the back page for a list of reason to call your doctor.  If you have any additional questions or concerns please contact your OB.

## 2018-02-16 NOTE — Lactation Note (Addendum)
Visited NICU mother in her room.  Reviewed breast pump use including proper pump settings, determining flange size and pump kit cleaning. Showed mother how to use the manual pump from her pump kit.  Discussed how milk supply is established and recommended pumping for 20 minutes at least 8 times every 24 hours.  Pt has breast milk collection supplies.  Mother declined when I offered to assess her breasts and review hand expression, but she said she knows how to hand express.   Recommended mother massage breasts and manually express in addition to pumping.  Recommended mother hold baby skin to skin and breastfeed when possible. Gave pump rental information and explained the difference between a personal and hospital grade breast pump. Current questions addressed.  Encouraged pt to call for assist prn.  Darol Destine RNC-MNN, IBCLC

## 2018-02-16 NOTE — Discharge Summary (Signed)
Obstetrical Discharge Form      Primary OB Clinician(s):       Admission Date : 02/13/2018  Discharge Date: 02/16/18      Discharge Diagnoses:     Patient Active Problem List   Diagnosis   . Hip sprain, left, initial encounter   . Left wrist sprain, initial encounter   . Thoracic back sprain, initial encounter   . Low back sprain, initial encounter   . Cervical sprain, initial encounter   . IUGR (intrauterine growth restriction) affecting care of mother, third trimester, not applicable or unspecified fetus   . [redacted] weeks gestation of pregnancy       Date of Delivery: 02/14/2018    Time of Delivery:  2:01 PM    Delivered By: Cristy Hilts   Delivery Type: Vaginal, Spontaneous     EDC: Estimated Date of Delivery: 03/07/18  Gestational Age:[redacted]w[redacted]d    Antepartum complications: FGR/  Abnormal dopplers    Tubal Ligation: n/a    Baby: Liveborn Female ; Apgar 1 minute: 8  Apgar 5 minute: 9 ; Birth weight: 3 lb 15 oz (1786 g)     Anesthesia:     Delivery complications:     Laceration:      Laceration Type: None      Episiotomy: None     Placenta:            Feeding method:       There is no immunization history for the selected administration types on file for this patient.     PostPartum Complications:  None      Discharge Instructions:         Discharge Medications:     Current Discharge Medication List      START taking these medications    Details   ibuprofen (ADVIL,MOTRIN) 600 MG tablet Take 1 tablet (600 mg total) by mouth every 6 (six) hours  Qty: 30 tablet, Refills: 1         CONTINUE these medications which have NOT CHANGED    Details   Prenatal MV-Min-Fe Fum-FA-DHA (PRENATAL 1 PO) Take by mouth      sertraline (ZOLOFT) 50 MG tablet Take 50 mg by mouth daily         STOP taking these medications       levonorgestrel (MIRENA) 20 MCG/24HR IUD                Follow-Up:   PFW in 4-6 w

## 2018-05-30 ENCOUNTER — Encounter (HOSPITAL_COMMUNITY): Payer: Self-pay

## 2018-08-03 IMAGING — US US MFM OB DETAIL+14 WK
1 series · 14 of 28 positions shown · non-contrast
Comparison: none

[Series 1: us mfm ob detail+14 wk · 109 acquisitions, 14 frames shown]
[im 5/109]
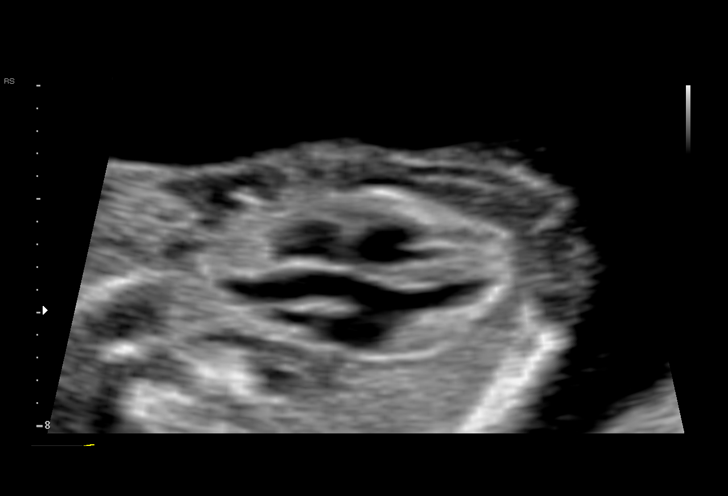
[im 13/109]
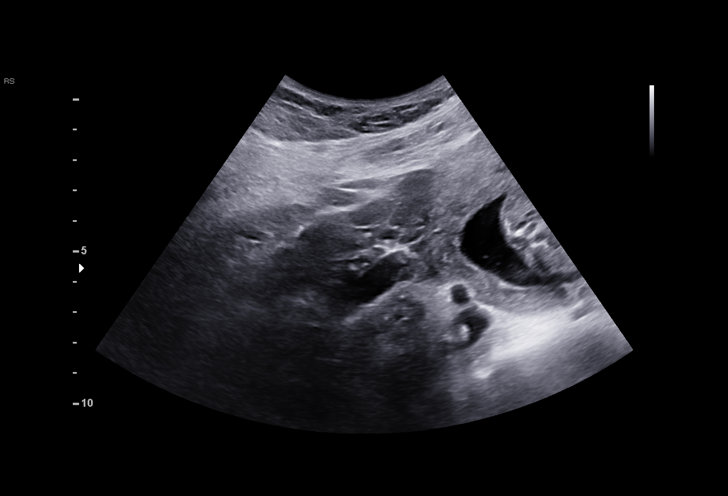
[im 21/109]
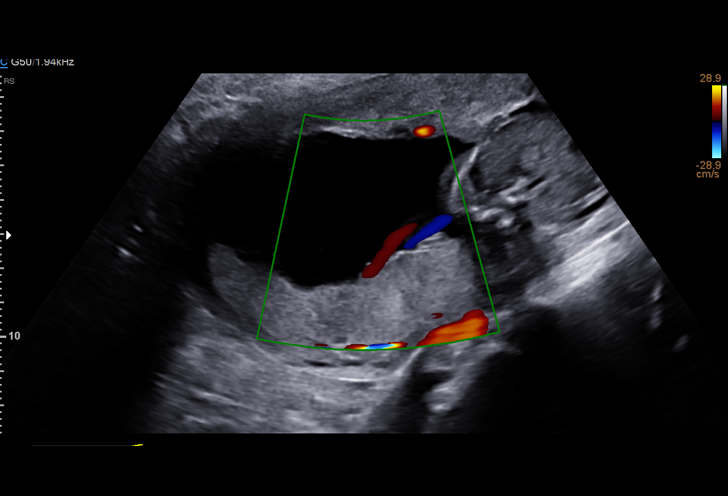
[im 29/109]
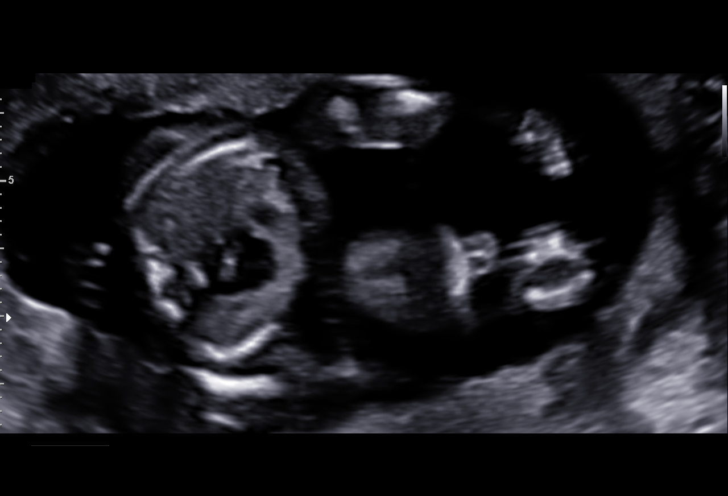
[im 37/109]
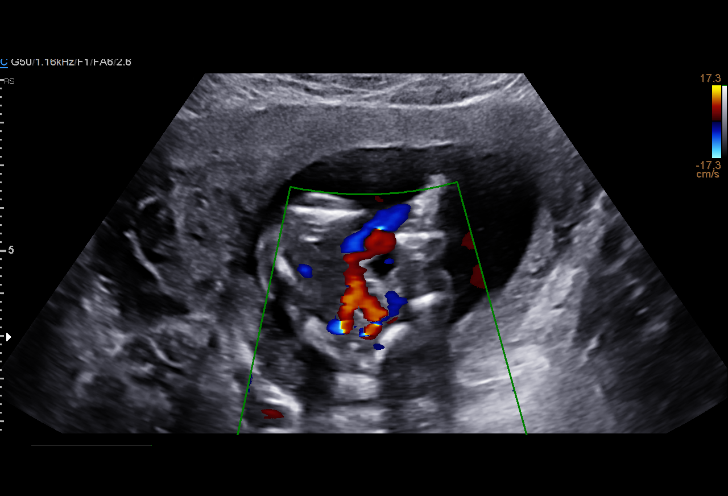
[im 45/109]
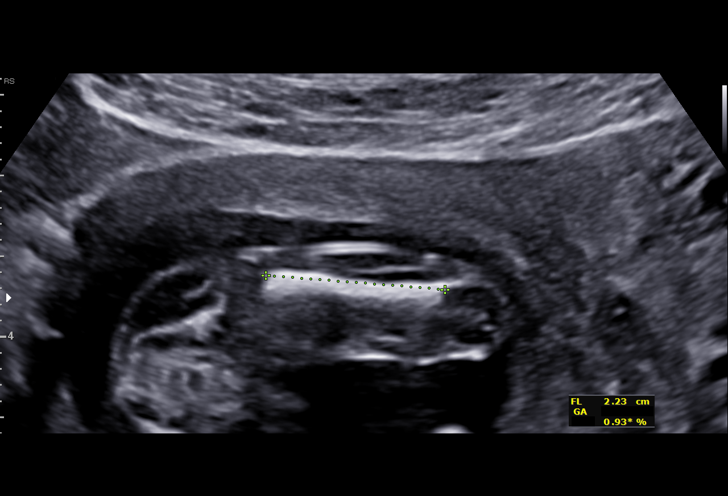
[im 53/109]
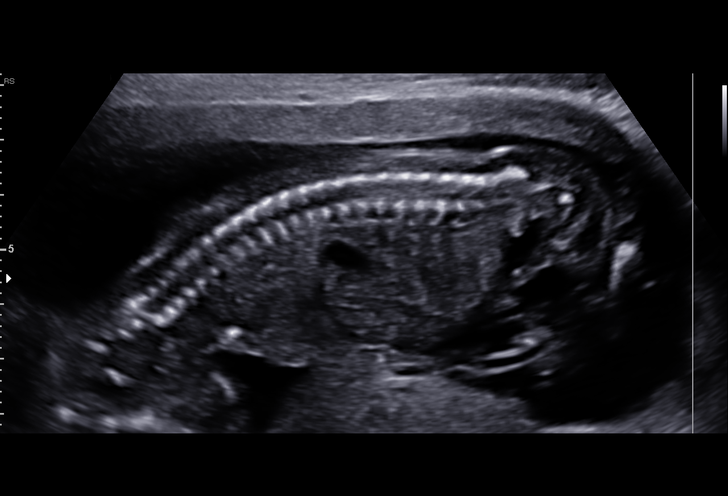
[im 61/109]
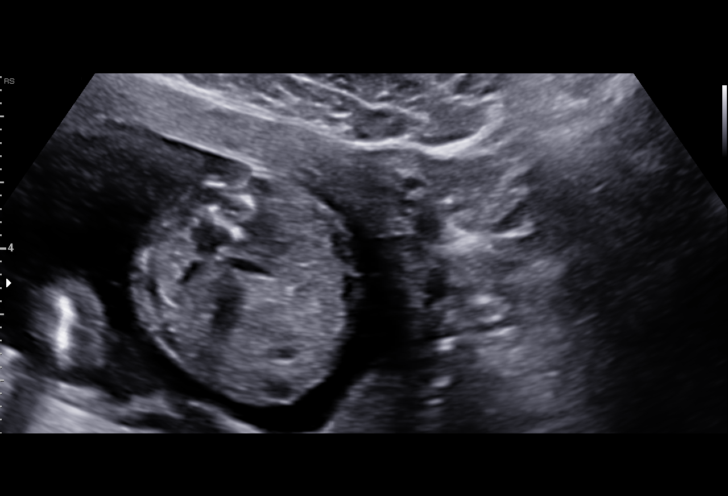
[im 69/109]
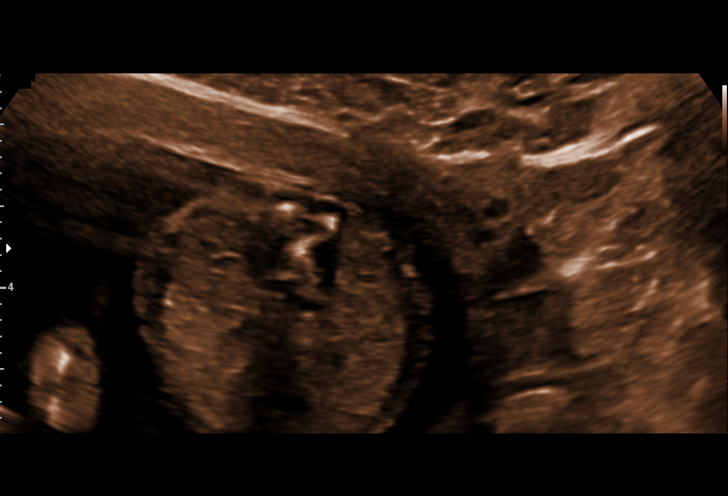
[im 77/109]
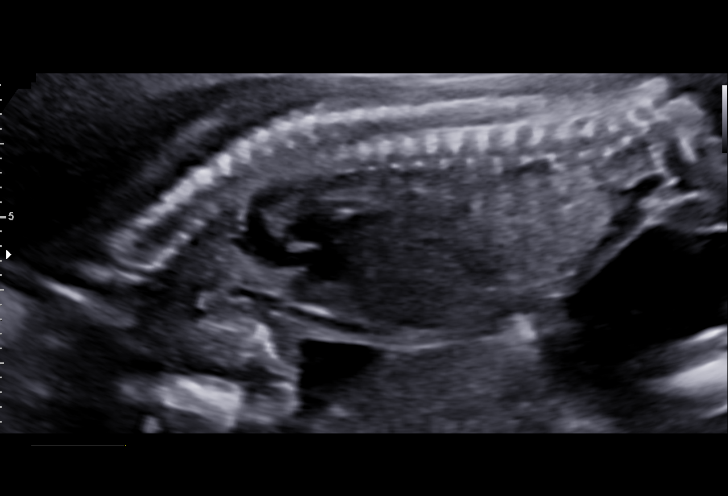
[im 85/109]
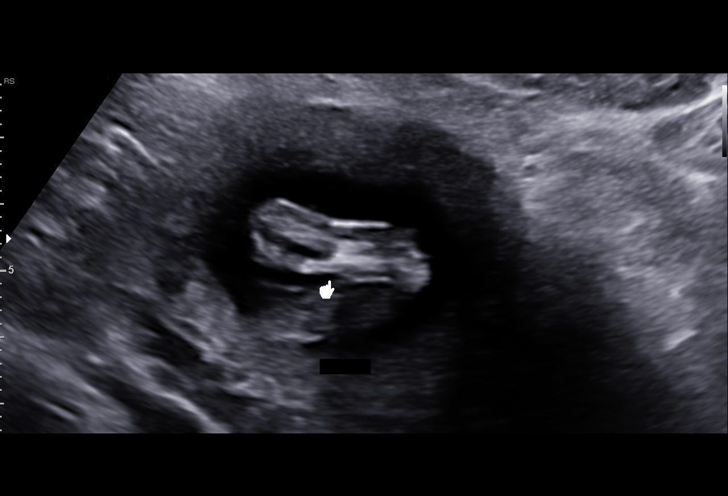
[im 93/109]
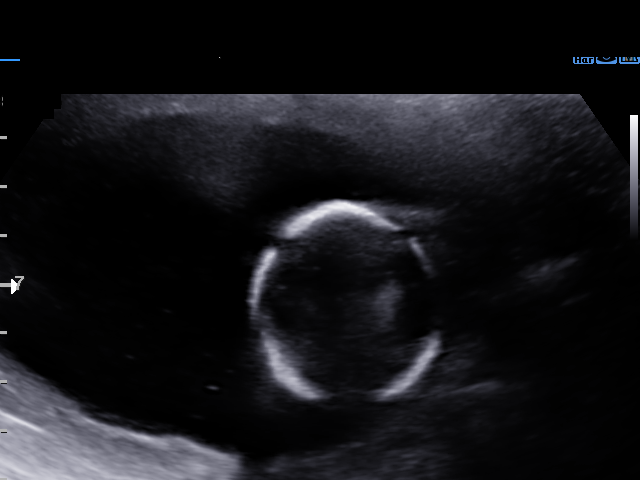
[im 101/109]
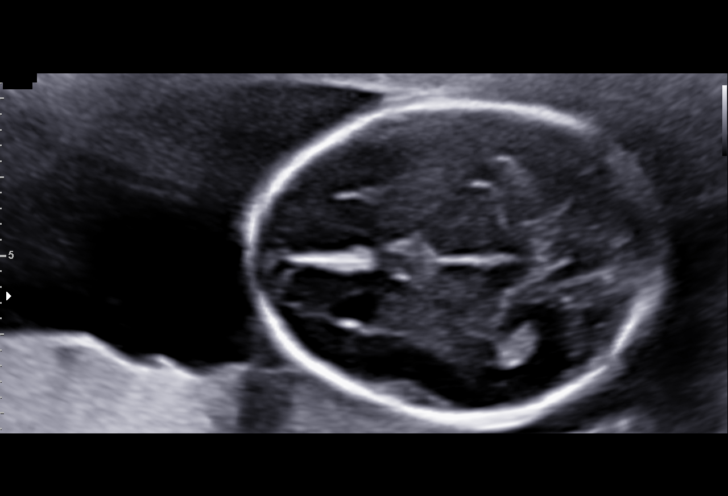
[im 109/109]
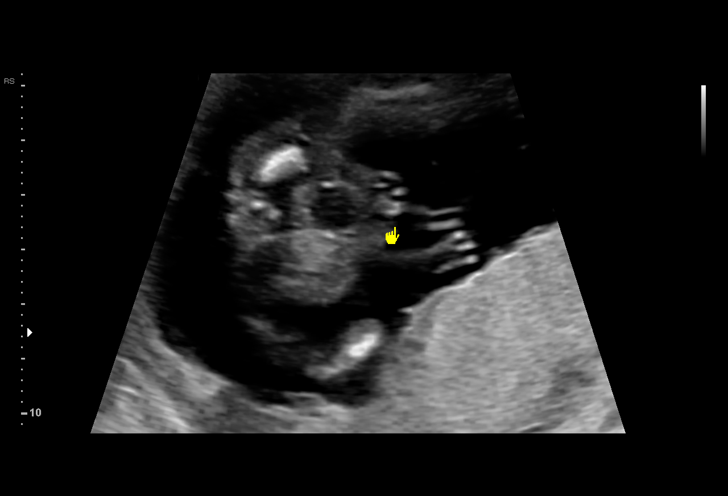

[14 of 28 positions shown; findings below may reference images not displayed]

pm)

[REDACTED]
Ave[facility] [HOSPITAL]

1  NAUFALIFAL ANSHAR                [id number]      [id number]     [id number]
Indications

18 weeks gestation of pregnancy
Encounter for antenatal screening for
malformations
Abnormal biochemical screen (OSBR [DATE])
OB History

Gravidity:    3         Term:   2
Living:       2
Fetal Evaluation

Num Of Fetuses:     1
Fetal Heart         151
Rate(bpm):
Cardiac Activity:   Observed
Presentation:       Breech
Placenta:           Posterior
P. Cord Insertion:  Visualized, central

Amniotic Fluid
AFI FV:      Subjectively within normal limits

Largest Pocket(cm)
4.8
Biometry

BPD:      41.6  mm     G. Age:  18w 4d         39  %    CI:        71.55   %    70 - 86
FL/HC:      14.4   %    16.1 -
HC:      156.6  mm     G. Age:  18w 5d         29  %    HC/AC:      1.23        1.09 -
AC:      127.1  mm     G. Age:  18w 2d         28  %    FL/BPD:     54.3   %
FL:       22.6  mm     G. Age:  16w 5d        < 3  %    FL/AC:      17.8   %    20 - 24
HUM:      22.9  mm     G. Age:  17w 0d        < 5  %
CER:      19.7  mm     G. Age:  18w 6d         50  %
NFT:       3.2  mm

CM:        3.8  mm

Est. FW:     206  gm      0 lb 7 oz   < 25  %
Gestational Age

LMP:           18w 6d        Date:  05/31/17                 EDD:   03/07/18
U/S Today:     18w 1d                                        EDD:   03/12/18
Best:          18w 6d     Det. By:  LMP  (05/31/17)          EDD:   03/07/18
Anatomy

Cranium:               Appears normal         Aortic Arch:            Appears normal
Cavum:                 Not well visualized    Ductal Arch:            Appears normal
Ventricles:            Appears normal         Diaphragm:              Appears normal
Choroid Plexus:        Appears normal         Stomach:                Appears normal, left
sided
Cerebellum:            Appears normal         Abdomen:                Appears normal
Posterior Fossa:       Appears normal         Abdominal Wall:         Appears nml (cord
insert, abd wall)
Nuchal Fold:           Appears normal         Cord Vessels:           Appears normal (3
vessel cord)
Face:                  Appears normal         Kidneys:                Appear normal
(orbits and profile)
Lips:                  Appears normal         Bladder:                Appears normal
Thoracic:              Appears normal         Spine:                  Appears normal
Heart:                 Not well visualized    Upper Extremities:      Appears normal
RVOT:                  Not well visualized    Lower Extremities:      Appears normal
LVOT:                  Appears normal

Other:  Female gender. Heels and 5th digit visualized. Technically difficult
due to fetal position.
Cervix Uterus Adnexa

Cervix
Normal appearance by transabdominal scan.

Uterus
No abnormality visualized.

Left Ovary
Within normal limits.

Right Ovary
Not visualized.

Adnexa:       No abnormality visualized. No adnexal mass
visualized.
Impression

We performed fetal anatomy scan. No makers of
aneuploidies or fetal structural defects are seen. Fetal spine
appears normal. Abdominal wall appears normal. Placenta
looks normal. Fetal biometry is consistent with her previously-
established dates. Amniotic fluid is normal and good fetal
activity is seen.
On serum screening, the risk of Down syndrome was not
increased. MSAFP MoM was 2.88 and the risk of Regmi
bifida was increased at [DATE].

Following structure(s) to be evaluated on her next visit:
-4-chamber view.
-RVOT

## 2018-08-26 LAB — BASIC METABOLIC PANEL
BUN / Creatinine Ratio: 15 (ref 9–23)
BUN: 9 mg/dL (ref 6–20)
CO2: 22 mmol/L (ref 20–29)
Calcium: 9.2 mg/dL (ref 8.7–10.2)
Chloride: 101 mmol/L (ref 96–106)
Creatinine: 0.59 mg/dL (ref 0.57–1.00)
EGFR: 123 mL/min/{1.73_m2} (ref >59)
EGFR: 141 mL/min/{1.73_m2} (ref >59)
Glucose: 97 mg/dL (ref 65–99)
Potassium: 4.2 mmol/L (ref 3.5–5.2)
Sodium: 139 mmol/L (ref 134–144)

## 2018-08-26 LAB — CBC AND DIFFERENTIAL
Baso(Absolute): 0 10*3/uL (ref 0.0–0.2)
Basos: 0 %
Eos: 1 %
Eosinophils Absolute: 0.1 10*3/uL (ref 0.0–0.4)
Hematocrit: 39.8 % (ref 34.0–46.6)
Hemoglobin: 12.7 g/dL (ref 11.1–15.9)
Immature Granulocytes Absolute: 0 10*3/uL (ref 0.0–0.1)
Immature Granulocytes: 0 %
Lymphocytes Absolute: 3.7 10*3/uL — ABNORMAL HIGH (ref 0.7–3.1)
Lymphocytes: 38 %
MCH: 27.3 pg (ref 26.6–33.0)
MCHC: 31.9 g/dL (ref 31.5–35.7)
MCV: 85 fL (ref 79–97)
Monocytes Absolute: 0.6 10*3/uL (ref 0.1–0.9)
Monocytes: 6 %
Neutrophils Absolute: 5.4 10*3/uL (ref 1.4–7.0)
Neutrophils: 55 %
Platelets: 309 10*3/uL (ref 150–379)
RBC: 4.66 x10E6/uL (ref 3.77–5.28)
RDW: 14.1 % (ref 12.3–15.4)
WBC: 9.8 10*3/uL (ref 3.4–10.8)

## 2018-08-26 LAB — LIPID PANEL, WITHOUT TOTAL CHOLESTEROL/HDL RATIO, SERUM
Cholesterol: 159 mg/dL (ref 100–199)
HDL: 44 mg/dL (ref >39)
LDL Calculated: 80 mg/dL (ref 0–99)
Triglycerides: 176 mg/dL — ABNORMAL HIGH (ref 0–149)
VLDL Calculated: 35 mg/dL (ref 5–40)

## 2018-08-26 LAB — TSH: TSH: 0.989 u[IU]/mL (ref 0.450–4.500)

## 2018-08-26 LAB — HEMOGLOBIN A1C: Hemoglobin A1C: 5.7 % — ABNORMAL HIGH (ref 4.8–5.6)

## 2018-09-14 IMAGING — US US MFM OB FOLLOW-UP
1 series · 13 of 28 positions shown · non-contrast
Comparison: none

[Series 1: us mfm ob follow-up · 71 acquisitions, 13 frames shown]
[im 3/71]
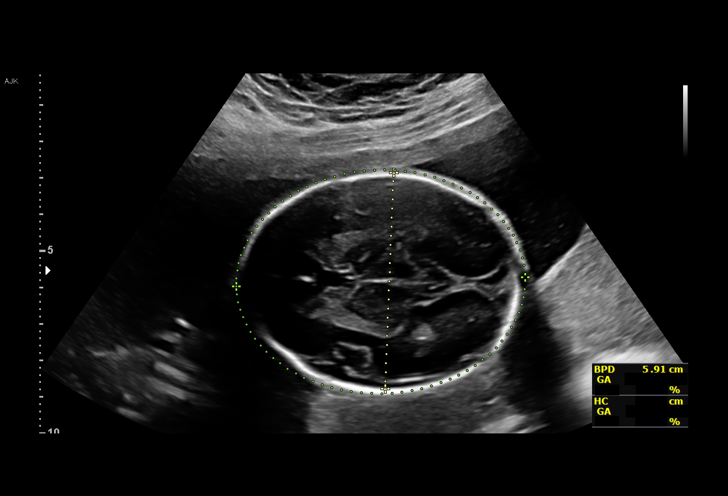
[im 8/71]
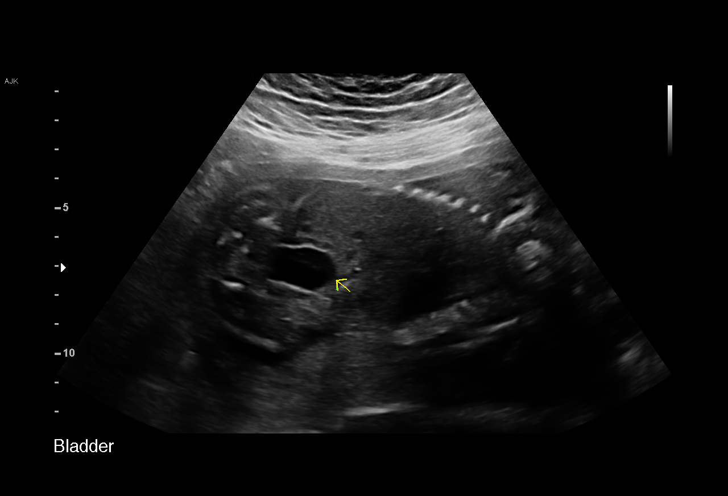
[im 13/71]
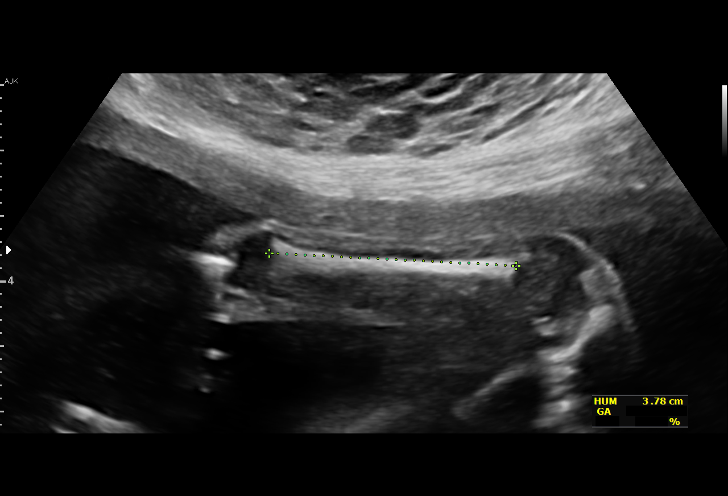
[im 19/71]
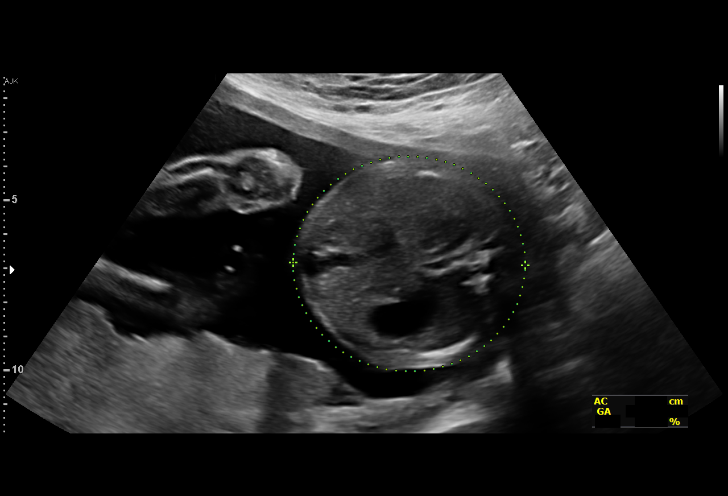
[im 24/71]
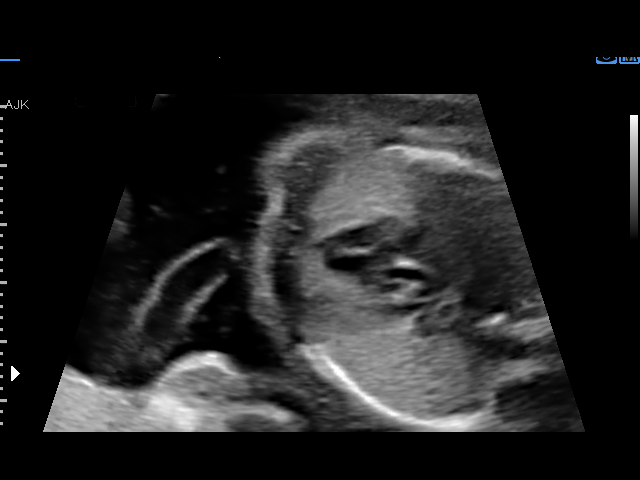
[im 29/71]
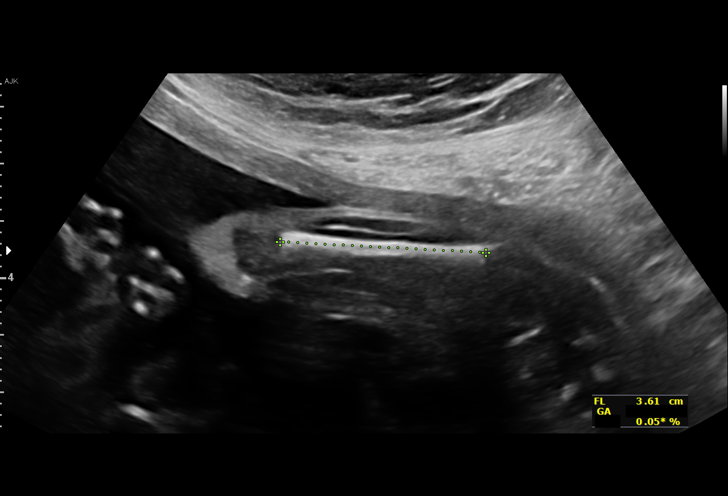
[im 37/71]
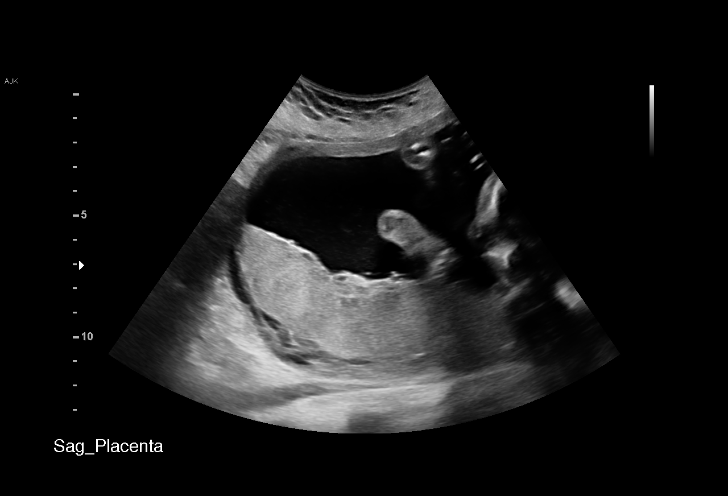
[im 42/71]
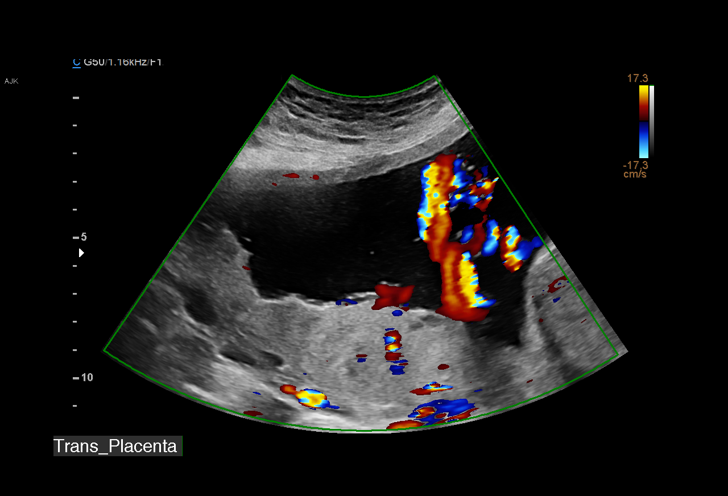
[im 47/71]
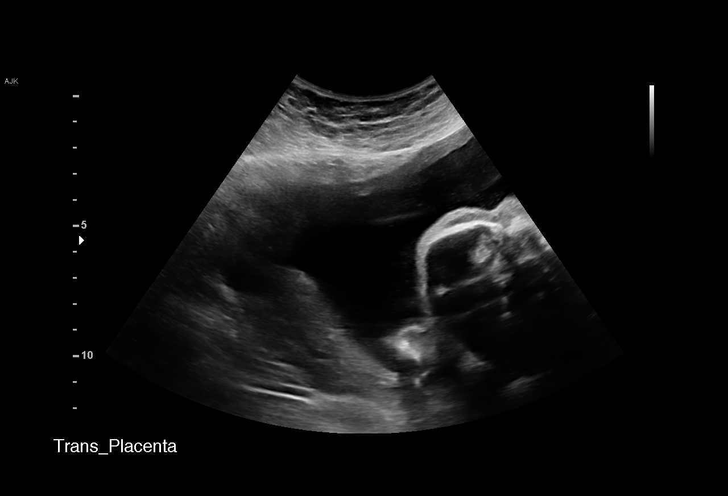
[im 52/71]
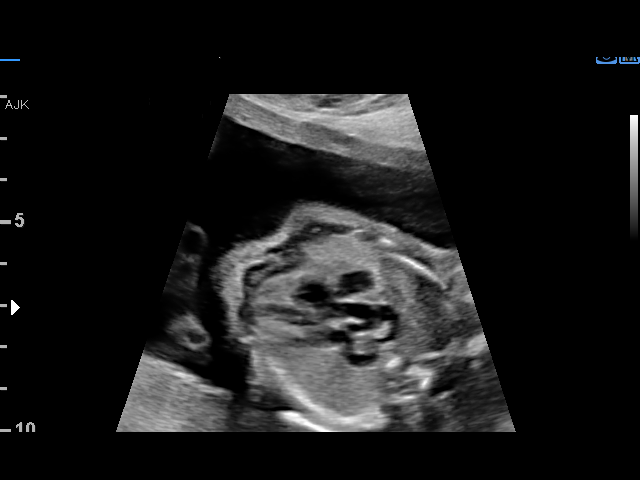
[im 58/71]
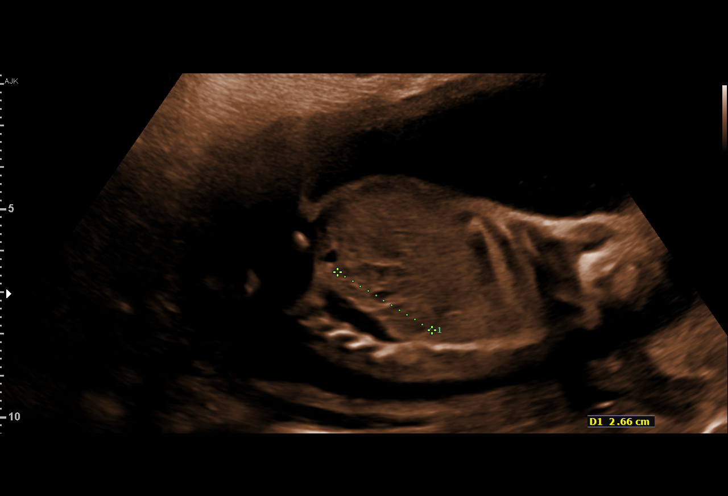
[im 63/71]
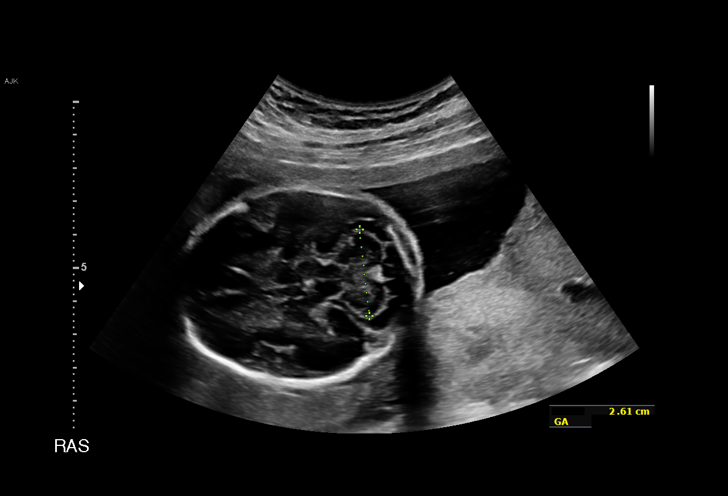
[im 68/71]
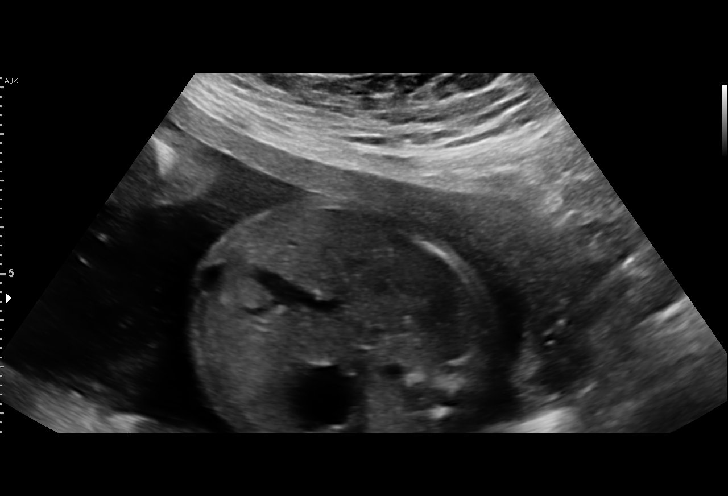

[13 of 28 positions shown; findings below may reference images not displayed]

[REDACTED]
Ave[facility] [HOSPITAL]

Indications

Encounter for other antenatal screening
follow-up
24 weeks gestation of pregnancy
Abnormal biochemical screen (OSBR [DATE])
Fetal Evaluation

Num Of Fetuses:         1
Fetal Heart Rate(bpm):  158
Cardiac Activity:       Observed
Presentation:           Cephalic
Placenta:               Posterior
P. Cord Insertion:      Visualized, central

Amniotic Fluid
AFI FV:      Within normal limits

Largest Pocket(cm)
5.97
Biometry

BPD:      59.2  mm     G. Age:  24w 1d         20  %    CI:        72.69   %    70 - 86
FL/HC:      16.2   %    18.7 -
HC:      220.8  mm     G. Age:  24w 1d         10  %    HC/AC:      1.09        1.04 -
AC:      203.5  mm     G. Age:  25w 0d         44  %    FL/BPD:     60.3   %    71 - 87
FL:       35.7  mm     G. Age:  21w 2d        < 3  %    FL/AC:      17.5   %    20 - 24
HUM:      37.2  mm     G. Age:  23w 0d        < 5  %
LV:        3.1  mm
RIGHT
ULN:      34.2  mm     G. Age:  23w 1d        < 5  %
TIB:        34  mm     G. Age:  22w 4d        < 5  %
RAD:      30.4  mm     G. Age:  21w 5d        < 5  %
FIB:      32.2  mm     G. Age:  21w 4d          7  %
LEFT
TIB:        34  mm     G. Age:  22w 4d        < 5  %
FIB:      32.2  mm     G. Age:  21w 4d          7  %

Est. FW:     600  gm      1 lb 5 oz     23  %
OB History

Gravidity:    3         Term:   2
Living:       2
Gestational Age

LMP:           24w 6d        Date:  05/31/17                 EDD:   03/07/18
U/S Today:     23w 5d                                        EDD:   03/15/18
Best:          24w 6d     Det. By:  LMP  (05/31/17)          EDD:   03/07/18
Anatomy

Cranium:               Appears normal         Aortic Arch:            Previously seen
Cavum:                 Appears normal         Ductal Arch:            Previously seen
Ventricles:            Appears normal         Diaphragm:              Appears normal
Choroid Plexus:        Previously seen        Stomach:                Appears normal, left
sided
Cerebellum:            Previously seen        Abdomen:                Appears normal
Posterior Fossa:       Previously seen        Abdominal Wall:         Previously seen
Nuchal Fold:           Previously seen        Cord Vessels:           Previously seen
Face:                  Orbits and profile     Kidneys:                Appear normal
previously seen
Lips:                  Appears normal         Bladder:                Appears normal
Thoracic:              Appears normal         Spine:                  Previously seen
Heart:                 Appears normal         Upper Extremities:      Previously seen
(4CH, axis, and situs
RVOT:                  Appears normal         Lower Extremities:      Previously seen
LVOT:                  Appears normal

Other:  Female gender previously seen. Heels and 5th digit previously seen.
Cervix Uterus Adnexa

Cervix
Length:           5.74  cm.
Normal appearance by transabdominal scan.

Uterus
No abnormality visualized.

Left Ovary
Not visualized.

Right Ovary
Not visualized.

Adnexa
No abnormality visualized.
Impression

On serum screening, the risk of Down syndrome was not
increased. MSAFP MoM was 2.88 and the risk of Fabio Ariel
bifida was increased at [DATE]. On previous ultrasound, the
spine and intracranial structures appear normal.
Fetal biometry is consistent with her previously-established
dates. The long-bone measurements lag gestational age by 3
weeks. The chest appears normal. Bones are well-
minearlized. Amniotic fluid is normal and good fetal activity is
seen.
I do not expect skeletal dysplasia. However, I recommend
serial fetal growth assessments to identify fetal growth
restriction.
Recommendations

An appointment was made for her to return in 4 weeks for
fetal growth assessment.

## 2018-10-12 IMAGING — US US MFM UA CORD DOPPLER
1 series · 14 of 28 positions shown · non-contrast
Comparison: none

[Series 1: us mfm ua cord doppler · 14 of 53 slices shown]
[im 2/53]
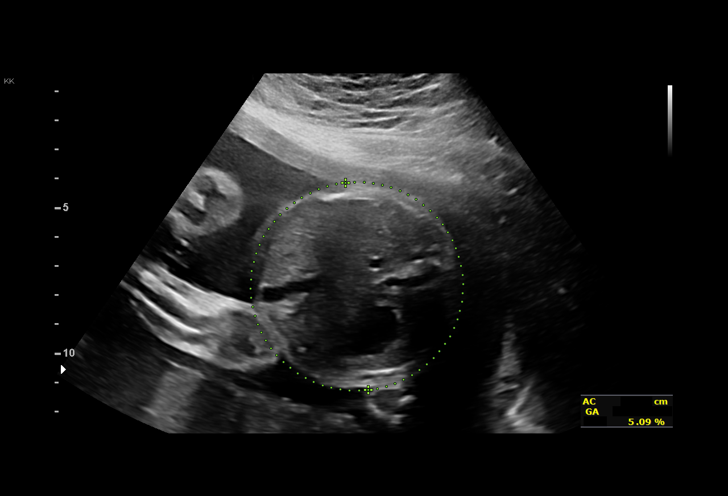
[im 6/53]
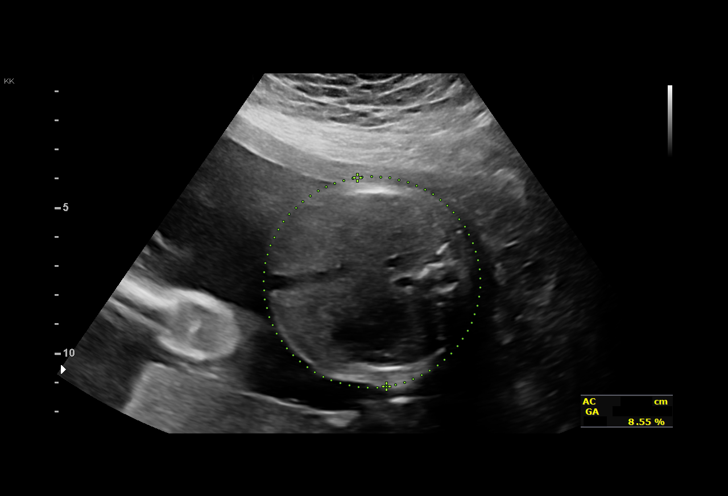
[im 10/53]
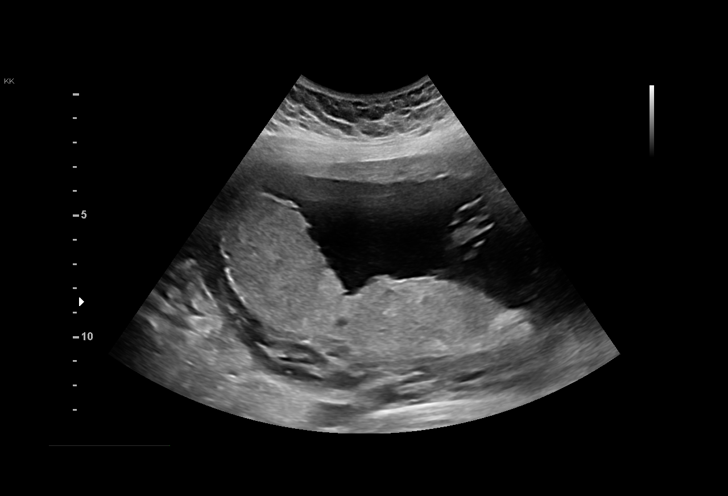
[im 14/53]
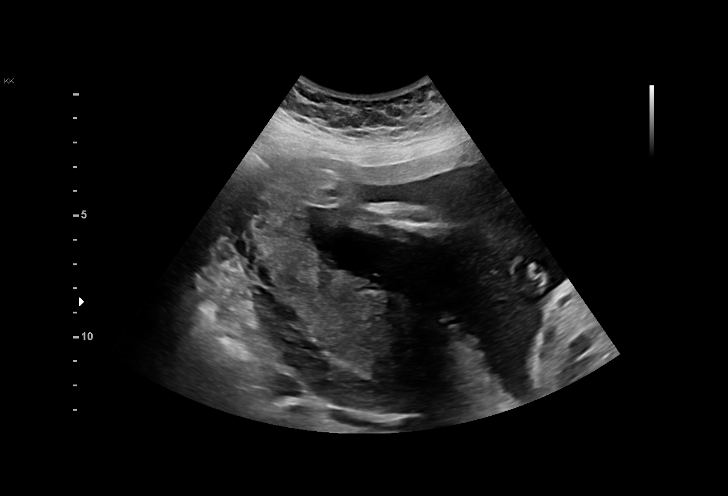
[im 18/53]
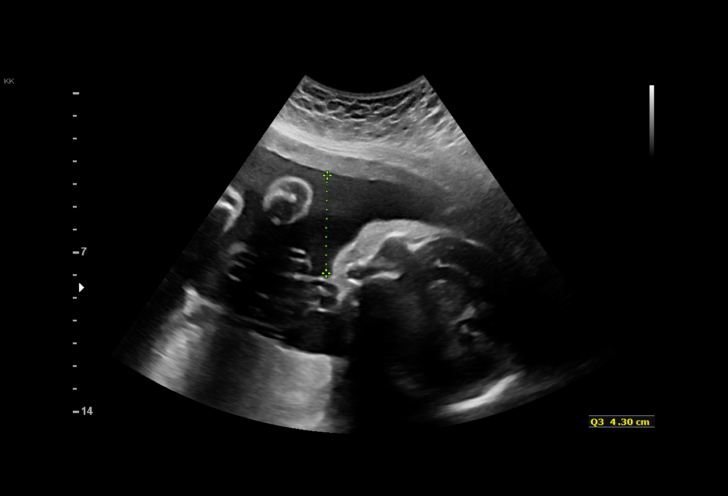
[im 22/53]
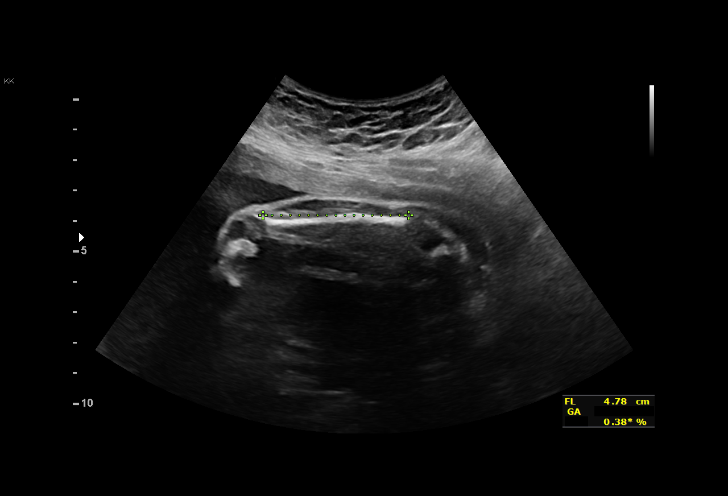
[im 26/53]
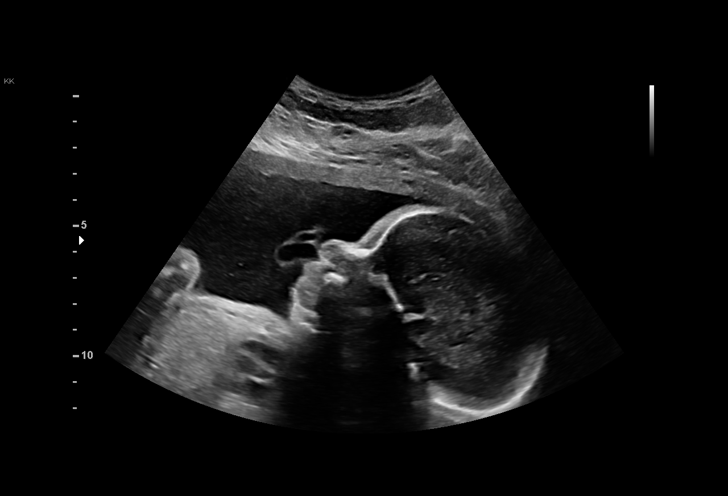
[im 29/53]
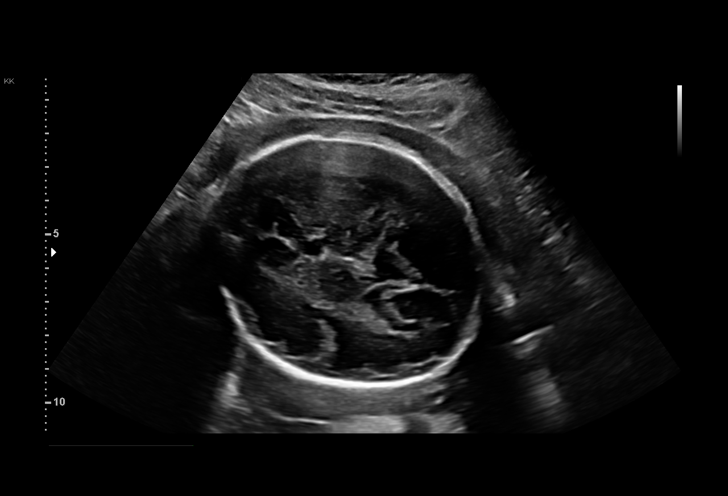
[im 33/53]
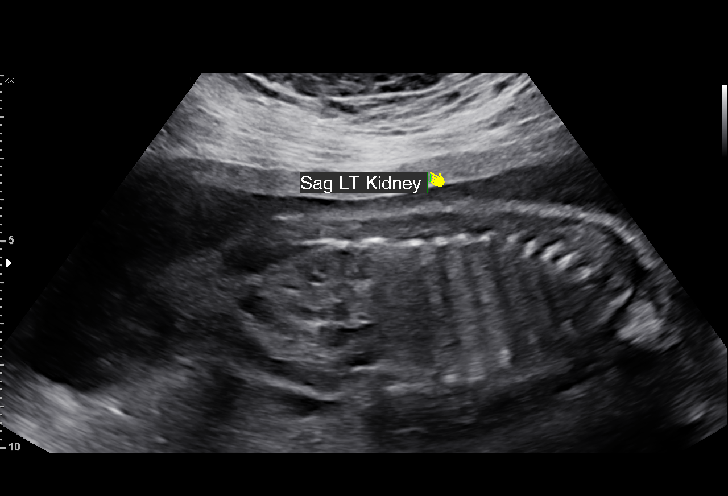
[im 37/53]
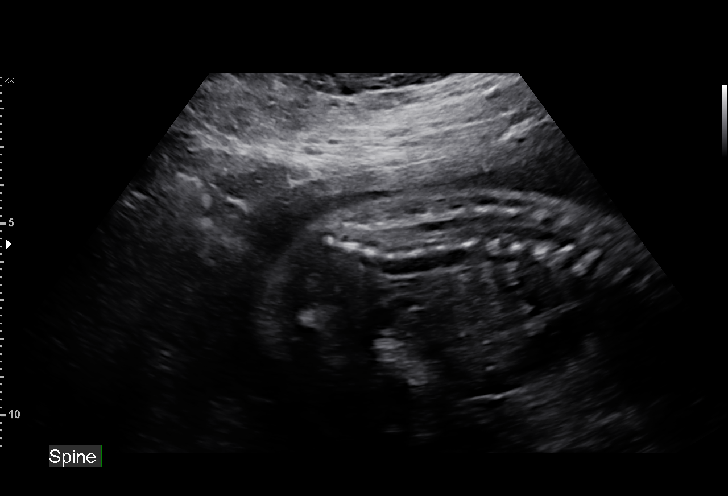
[im 41/53]
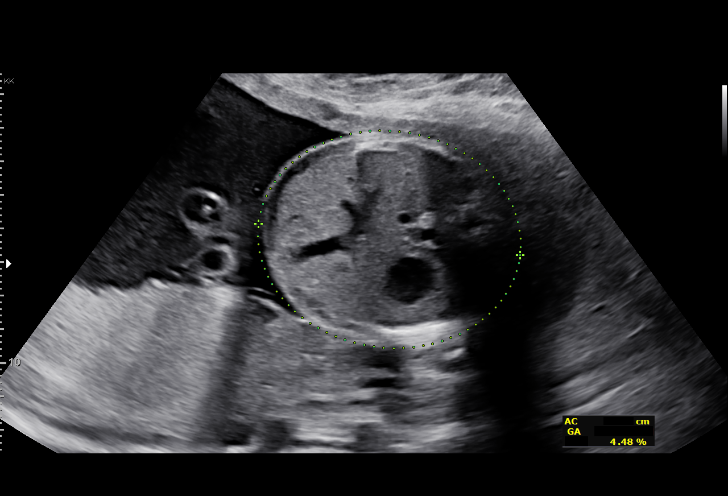
[im 45/53]
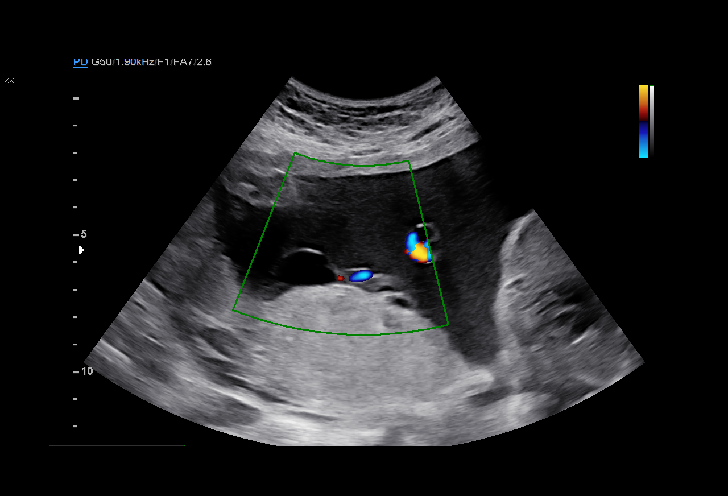
[im 49/53]
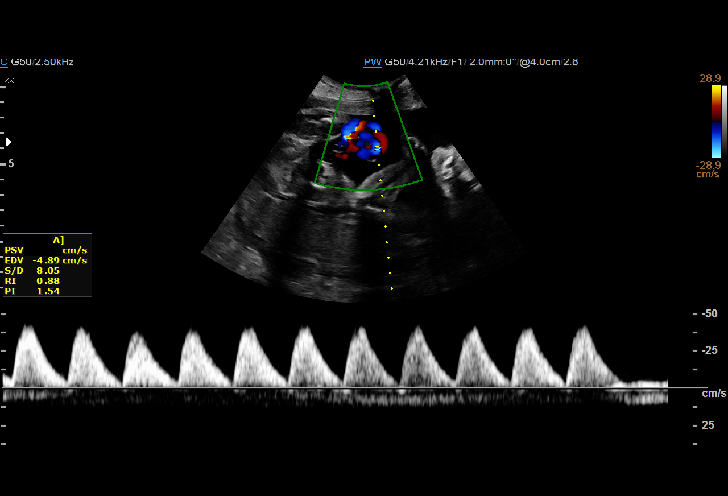
[im 53/53]
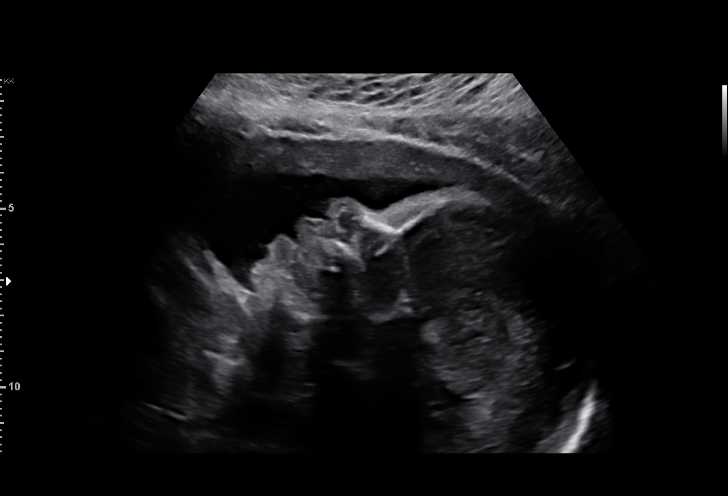

[14 of 28 positions shown; findings below may reference images not displayed]

[REDACTED]
Ave[facility] [HOSPITAL]

2  US MFM UA CORD DOPPLER               76820.02     YESURY FACETE

Indications

Abnormal biochemical screen (OSBR [DATE])
28 weeks gestation of pregnancy
Encounter for other antenatal screening
follow-up
Maternal care for known or suspected poor
fetal growth, third trimester, not applicable or
unspecified
Fetal Evaluation

Num Of Fetuses:         1
Fetal Heart Rate(bpm):  140
Cardiac Activity:       Observed
Presentation:           Cephalic
Placenta:               Posterior
P. Cord Insertion:      Previously Visualized

Amniotic Fluid
AFI FV:      Within normal limits

AFI Sum(cm)     %Tile       Largest Pocket(cm)
18.32           70

RUQ(cm)       RLQ(cm)       LUQ(cm)        LLQ(cm)
4.2
Biometry
BPD:        70  mm     G. Age:  28w 1d         17  %    CI:        73.36   %    70 - 86
FL/HC:      18.1   %    19.6 -
HC:      259.7  mm     G. Age:  28w 2d          8  %    HC/AC:      1.14        0.99 -
AC:      228.8  mm     G. Age:  27w 2d          8  %    FL/BPD:     67.0   %    71 - 87
FL:       46.9  mm     G. Age:  25w 5d        < 3  %    FL/AC:      20.5   %    20 - 24

Est. FW:     990  gm      2 lb 3 oz     18  %
OB History

Gravidity:    3         Term:   2
Living:       2
Gestational Age

LMP:           28w 6d        Date:  05/31/17                 EDD:   03/07/18
U/S Today:     27w 3d                                        EDD:   03/17/18
Best:          28w 6d     Det. By:  LMP  (05/31/17)          EDD:   03/07/18
Anatomy

Cranium:               Appears normal         Aortic Arch:            Previously seen
Cavum:                 Previously seen        Ductal Arch:            Previously seen
Ventricles:            Appears normal         Diaphragm:              Appears normal
Choroid Plexus:        Previously seen        Stomach:                Appears normal, left
sided
Cerebellum:            Previously seen        Abdomen:                Appears normal
Posterior Fossa:       Previously seen        Abdominal Wall:         Previously seen
Nuchal Fold:           Previously seen        Cord Vessels:           Previously seen
Face:                  Orbits and profile     Kidneys:                Appear normal
previously seen
Lips:                  Appears normal         Bladder:                Appears normal
Thoracic:              Appears normal         Spine:                  Previously seen
Heart:                 Appears normal         Upper Extremities:      Previously seen
(4CH, axis, and situs
RVOT:                  Appears normal         Lower Extremities:      Previously seen
LVOT:                  Appears normal

Other:  Female gender previously seen. Heels and 5th digit previously seen.
Doppler - Fetal Vessels

Umbilical Artery
S/D     %tile                                            ADFV    RDFV
5.2    > 97.5                                               No      No

Cervix Uterus Adnexa

Cervix
Not visualized (advanced GA >51wks)
Comments

I Discussed today's findings of normal EFW with lagging AC.
We discussed the concern for possible placental insufficiency.
UA Dopplers performed and were elevated above the 95th%.
Impression

EFW >10th with AC< 10th
Recommendations

Initiate weekly UA dopplers with BPP
Follow up growth in 3 weeks was scheduled.
